# Patient Record
Sex: Male | Born: 1990 | Race: White | Hispanic: No | Marital: Single | Smoking: Current every day smoker
Health system: Southern US, Community
[De-identification: ages and names within clinical notes are randomized; demographics above are authoritative.]

## PROBLEM LIST (undated history)

## (undated) HISTORY — PX: NO PAST SURGERIES: SHX2092

---
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
## 2011-01-17 ENCOUNTER — Emergency Department (HOSPITAL_COMMUNITY): Payer: Self-pay

## 2011-01-17 ENCOUNTER — Encounter: Payer: Self-pay | Admitting: Emergency Medicine

## 2011-01-17 ENCOUNTER — Emergency Department (HOSPITAL_COMMUNITY)
Admission: EM | Admit: 2011-01-17 | Discharge: 2011-01-17 | Disposition: A | Discharge status: Home / Self Care | Payer: Self-pay | Attending: Emergency Medicine | Admitting: Emergency Medicine

## 2011-01-17 DIAGNOSIS — F17200 Nicotine dependence, unspecified, uncomplicated: Secondary | ICD-10-CM | POA: Insufficient documentation

## 2011-01-17 DIAGNOSIS — W230XXA Caught, crushed, jammed, or pinched between moving objects, initial encounter: Secondary | ICD-10-CM | POA: Insufficient documentation

## 2011-01-17 DIAGNOSIS — IMO0002 Reserved for concepts with insufficient information to code with codable children: Secondary | ICD-10-CM | POA: Insufficient documentation

## 2011-01-17 DIAGNOSIS — S68119A Complete traumatic metacarpophalangeal amputation of unspecified finger, initial encounter: Secondary | ICD-10-CM

## 2011-01-17 DIAGNOSIS — Z23 Encounter for immunization: Secondary | ICD-10-CM | POA: Insufficient documentation

## 2011-01-17 DIAGNOSIS — Y9269 Other specified industrial and construction area as the place of occurrence of the external cause: Secondary | ICD-10-CM | POA: Insufficient documentation

## 2011-01-17 MED ORDER — TETANUS-DIPHTH-ACELL PERTUSSIS 5-2.5-18.5 LF-MCG/0.5 IM SUSP
0.5000 mL | Freq: Once | INTRAMUSCULAR | Status: AC
  Administered 2011-01-17: 0.5 mL via INTRAMUSCULAR
  Filled 2011-01-17: qty 0.5

## 2011-01-17 MED ORDER — ONDANSETRON HCL 4 MG/2ML IJ SOLN
4.0000 mg | Freq: Once | INTRAMUSCULAR | Status: AC
  Administered 2011-01-17: 4 mg via INTRAVENOUS

## 2011-01-17 MED ORDER — MORPHINE SULFATE 4 MG/ML IJ SOLN
6.0000 mg | Freq: Once | INTRAMUSCULAR | Status: AC
  Administered 2011-01-17: 6 mg via INTRAVENOUS

## 2011-01-17 MED ORDER — CEPHALEXIN 500 MG PO CAPS: 500.0000 mg | ORAL_CAPSULE | Freq: Four times a day (QID) | ORAL | Status: DC

## 2011-01-17 MED ORDER — ONDANSETRON HCL 4 MG/2ML IJ SOLN
INTRAMUSCULAR | Status: AC
  Administered 2011-01-17: 4 mg via INTRAVENOUS
  Filled 2011-01-17: qty 2

## 2011-01-17 MED ORDER — CEFAZOLIN SODIUM 1-5 GM-% IV SOLN
1.0000 g | Freq: Once | INTRAVENOUS | Status: AC
  Administered 2011-01-17: 1 g via INTRAVENOUS
  Filled 2011-01-17: qty 50

## 2011-01-17 MED ORDER — HYDROMORPHONE HCL PF 1 MG/ML IJ SOLN
INTRAMUSCULAR | Status: AC
  Administered 2011-01-17: 1 mg via INTRAVENOUS
  Filled 2011-01-17: qty 1

## 2011-01-17 MED ORDER — OXYCODONE-ACETAMINOPHEN 5-325 MG PO TABS: 2.0000 | ORAL_TABLET | ORAL | Status: DC | PRN

## 2011-01-17 MED ORDER — HYDROMORPHONE HCL PF 1 MG/ML IJ SOLN
1.0000 mg | Freq: Once | INTRAMUSCULAR | Status: AC
  Administered 2011-01-17: 1 mg via INTRAVENOUS

## 2011-01-17 MED ORDER — HYDROMORPHONE HCL PF 1 MG/ML IJ SOLN
1.0000 mg | Freq: Once | INTRAMUSCULAR | Status: AC
  Administered 2011-01-17: 1 mg via INTRAVENOUS
  Filled 2011-01-17: qty 1

## 2011-01-17 MED ORDER — MORPHINE SULFATE 4 MG/ML IJ SOLN
INTRAMUSCULAR | Status: AC
  Administered 2011-01-17: 6 mg via INTRAVENOUS
  Filled 2011-01-17: qty 2

## 2011-01-17 NOTE — ED Provider Notes (Signed)
History  Scribed for Rolan Bucco, MD, the patient was seen in room APA04. This chart was scribed by Hillery Hunter. The patient's care started at 12:10   CSN: [id number] Arrival date & time: 01/17/2011 11:44 AM   First MD Initiated Contact with Patient 01/17/11 1159      Chief Complaint  Patient presents with  . Extremity Laceration    The history is provided by the patient.  Philip Espinoza is a 20 y.o. male who presents to the Emergency Department with crush injury to right third and fourth fingers. He reports that he was cutting wood with a mechanical wood splitter when he accidentally got his right third and fourth fingers caught in the machine at work at 11:00am today.      History reviewed. No pertinent past medical history.  History reviewed. No pertinent past surgical history.  No family history on file.  History  Substance Use Topics  . Smoking status: Current Everyday Smoker  . Smokeless tobacco: Not on file  . Alcohol Use: Yes     on occasion      Review of Systems  HENT: Negative for neck pain.   Respiratory: Negative for shortness of breath.   Cardiovascular: Negative for chest pain.  Gastrointestinal: Negative for abdominal pain.  Musculoskeletal: Negative for back pain.  Skin: Positive for wound (laceration to third and fourth finger of the right hand).  Neurological: Negative for dizziness, weakness and light-headedness.  Psychiatric/Behavioral: Negative for confusion.  All other systems reviewed and are negative.    Allergies  Review of patient's allergies indicates no known allergies.  Home Medications   Current Outpatient Rx  Name Route Sig Dispense Refill  . CEPHALEXIN 500 MG PO CAPS Oral Take 1 capsule (500 mg total) by mouth 4 (four) times daily. 20 capsule 0  . OXYCODONE-ACETAMINOPHEN 5-325 MG PO TABS Oral Take 2 tablets by mouth every 4 (four) hours as needed for pain. 30 tablet 0    BP 135/85  Pulse 72  Temp(Src)  97.6 F (36.4 C) (Oral)  Resp 20  SpO2 100%  Physical Exam  Nursing note and vitals reviewed. Constitutional: He is oriented to person, place, and time. He appears well-developed and well-nourished.  HENT:  Head: Normocephalic and atraumatic.  Neck: Normal range of motion. Neck supple.  Cardiovascular: Normal rate, regular rhythm and normal heart sounds.   Pulmonary/Chest: Effort normal and breath sounds normal.  Abdominal: Soft. Bowel sounds are normal. There is no tenderness. There is no rebound and no guarding.  Musculoskeletal: Normal range of motion. He exhibits no edema and no tenderness.       Crush injury and partial amputation of distal phalanx third and fourth fingers  Normal movement in fingers Capillary refill is less than 2 No injury to the rest of the hand  Neurological: He is alert and oriented to person, place, and time.  Skin: Skin is warm and dry. No rash noted.  Psychiatric: He has a normal mood and affect.    ED Course  Procedures   Labs Reviewed - No data to display Dg Hand Complete Right  01/17/2011  *RADIOLOGY REPORT*  Clinical Data: Laceration  RIGHT HAND - COMPLETE 3+ VIEW  Comparison: None.  Findings: There has been traumatic amputation across the tuft of the distal phalanges long and ring fingers.  Overlying soft tissue defects.  The fracture line in the distal phalanx of the long finger extends to the base involving the subchondral cortex without distraction or step-off  deformity.  There is a nondisplaced oblique fracture across the tuft of the distal phalanx of the index finger.  IMPRESSION:  1.  Traumatic amputation across the distal phalanges  long and ring fingers as above. 2.  Minimally displaced tuft fracture, distal phalanx index finger.  Original Report Authenticated By: Osa Craver, M.D.     OTHER DATA REVIEWED: Nursing notes, vital signs reviewed.   DIAGNOSTIC STUDIES: Oxygen Saturation is 100% on room air, normal by my  interpretation.     ED COURSE / COORDINATION OF CARE: 12:00. Ordered: XR right hand, complete. 12:10. Ordered ceFAZolin (ANCEF) IVPB 1 g/50 mL premix ; TDaP (BOOSTRIX) injection 0.5 mL, Morphine 6mg  IV, Zofran 4mg  IV.  12:30. I discussed the case with orthopedic specialist who recommended that patient be discharged home to follow-up with referral in his office.  12:46. Patient reports pain not improved with Morphine. Ordered Dilaudid 1mg  IV.    MDM  Pt with apparent crush injury/partial amputation to fingers.  Will check x-ray to r/o extent of injury  Clinical impression: partial finger amputations      I personally performed the services described in this documentation, which was scribed in my presence.  The recorded information has been reviewed and considered.    Rolan Bucco, MD 01/17/11 [phone]

## 2011-01-17 NOTE — ED Notes (Signed)
Pt states that at approx 11:00 am today, he was cutting wood and hit his right hand (index, middle, and ring fingers) with mechanical wood splitter.

## 2011-01-17 NOTE — ED Notes (Signed)
EDP at bedside  

## 2011-01-19 ENCOUNTER — Encounter: Payer: Self-pay | Admitting: Orthopedic Surgery

## 2011-01-19 ENCOUNTER — Ambulatory Visit (INDEPENDENT_AMBULATORY_CARE_PROVIDER_SITE_OTHER): Payer: Self-pay | Admitting: Orthopedic Surgery

## 2011-01-19 VITALS — BP 130/70 | Ht 72.0 in | Wt 162.8 lb

## 2011-01-19 DIAGNOSIS — IMO0002 Reserved for concepts with insufficient information to code with codable children: Secondary | ICD-10-CM

## 2011-01-19 DIAGNOSIS — S68119A Complete traumatic metacarpophalangeal amputation of unspecified finger, initial encounter: Secondary | ICD-10-CM

## 2011-01-19 MED ORDER — OXYCODONE-ACETAMINOPHEN 5-325 MG PO TABS: 1.0000 | ORAL_TABLET | ORAL | Status: DC | PRN

## 2011-01-19 NOTE — Progress Notes (Signed)
Chief complaint: Amputations RIGHT ring and long finger HPI:(3) 20 year old male, status post partial amputation to RIGHT ring and long finger from a wood splitter. Injury occurred on the of November. He was evaluated in the emergency room. The wounds were cleaned and dressed. He came into the office today on the 26th for evaluation. He will need debridement and exploration of the RIGHT ring and long finger to assess the proper treatment.  Complaints of pain.  ROS:(2) vomiting, Changes in the skin, itching, numbness, tingling, dizziness, nervousness, bruising  PFSH: (1) , chills and fatigueMedical history negative surgical history negative family history diabetes social history he is single and he works in [employer] he has a mild smoking history no alcohol use.  Physical Exam(12) GENERAL: normal development   CDV: pulses are normal   Skin: normal  Lymph: nodes were not palpable/normal  Psychiatric: awake, alert and oriented  Neuro: normal sensation  MSK Ambulatory normal. 1 RIGHT Long  finger has some exposed bone and there does not appear to be soft tissue that can cover the bone. The IP joint flexion is intact as is extension. Color is good. No necrotic tissue is noted. 2 RIGHT ring finger This amputation is more transverse. The IP joint flexion is normal. Color is good. No necrotic tissue. There does appear to be some exposed bone, but it is minimal and should respond well to debridement. 3 RIGHT and LEFT hand, otherwise, normal range of motion, stability, strength and alignment. 4 Lower extremities normal range of motion, strength, stability and alignment.   Imaging: X-rays show distal phalanx fractures, ring finger, long finger and index finger area  Assessment: Partial amputations, RIGHT long finger and ring finger    Plan: Debridement RIGHT ring finger and long finger

## 2011-01-19 NOTE — Patient Instructions (Signed)
You have been scheduled for surgery.  All surgeries carry some risk.  Remember you always have the option of continued nonsurgical treatment. However in this situation the risks vs. the benefits favor surgery as the best treatment option. The risks of the surgery includes the following but is not limited to bleeding, infection, pulmonary embolus, death from anesthesia, nerve injury vascular injury or need for further surgery, continued pain.  Specific to this procedure the following risks and complications are rare but possible You will have nail deformity and some sensory loss to the digits

## 2011-01-20 ENCOUNTER — Encounter (HOSPITAL_COMMUNITY): Payer: Self-pay

## 2011-01-20 ENCOUNTER — Encounter (HOSPITAL_COMMUNITY)
Admission: RE | Admit: 2011-01-20 | Discharge: 2011-01-20 | Disposition: A | Discharge status: Home / Self Care | Payer: Self-pay | Source: Ambulatory Visit | Attending: Orthopedic Surgery | Admitting: Orthopedic Surgery

## 2011-01-20 LAB — CBC
HCT: 47 % (ref 39.0–52.0)
MCH: 31.9 pg (ref 26.0–34.0)
MCHC: 33.8 g/dL (ref 30.0–36.0)
MCV: 94.2 fL (ref 78.0–100.0)
RDW: 14 % (ref 11.5–15.5)
WBC: 7.4 10*3/uL (ref 4.0–10.5)

## 2011-01-20 NOTE — Patient Instructions (Addendum)
20 Philip Espinoza  01/20/2011   Your procedure is scheduled on:  01/21/2011  Report to [facility] at 11:30 AM.  Call this number if you have problems the morning of surgery: [phone]   Remember:   Do not eat food:After Midnight.  May have clear liquids:until Midnight .  Clear liquids include soda, tea, black coffee, apple or grape juice, broth.  Take these medicines the morning of surgery with A SIP OF WATER: Percocet   Do not wear jewelry, make-up or nail polish.  Do not wear lotions, powders, or perfumes. You may wear deodorant.  Do not shave 48 hours prior to surgery.  Do not bring valuables to the hospital.  Contacts, dentures or bridgework may not be worn into surgery.  Leave suitcase in the car. After surgery it may be brought to your room.  For patients admitted to the hospital, checkout time is 11:00 AM the day of discharge.   Patients discharged the day of surgery will not be allowed to drive home.  Name and phone number of your driver:   Special Instructions: CHG Shower Use Special Wash: 1/2 bottle night before surgery and 1/2 bottle morning of surgery.   Please read over the following fact sheets that you were given: Pain Booklet, MRSA Information, Surgical Site Infection Prevention, Anesthesia Post-op Instructions and Care and Recovery After Surgery     Wound Debridement A wound must be clean to heal. It also must get a good supply of blood. Anything that is stopping this must be taken out of the wound. This could be dead tissue, scar tissue, or a collection of fluid (pus). Something from outside the body also might have gotten into the wound. These items are removed in a procedure called debridement. Wounds that are not cleaned by debridement heal slowly or not at all. They can become infected. The infection can also spread to nearby areas or to other parts of the body through the blood. Debridement is sometimes done to get a sample of tissue from the wound. The  tissue can be checked under a microscope or sent to a lab for testing. LET YOUR CAREGIVER KNOW ABOUT:   Allergies to food or medicine.   Medicines taken, including vitamins, herbs, eyedrops, over-the-counter medicines, and creams.   Use of steroids (by mouth or creams).   Previous problems with anesthetics or numbing medicines.   History of bleeding problems or blood clots.   Previous surgery.   Other health problems, including diabetes and kidney problems.   Possibility of pregnancy, if this applies.  RISKS AND COMPLICATIONS  Problems from debridement are rare. If they do occur, they are usually not serious. Risks could include:  Bleeding that does not stop.   Infection.   Damage to healthy tissue inside the wound. This could include blood vessels or nerves.   Pain.   Lack of healing. This could mean another debridement is needed.  BEFORE THE PROCEDURE   The wound will be checked for signs of healing or infection. This may include:   Blood tests to check for infection.   Measuring the wound. This includes how deep it is. A metal tool (probe) may be used.   You must give informed consent. This requires signing a legal paper that gives permission for the procedure.   You may need to speak with the person who would give you anesthesia during the procedure (anesthesiologist). Sometimes anesthesia is not needed. If anesthesia is used, the area around the wound may  be numbed (local anesthesia). If the wound is deep or wide, you may be put to sleep during the surgery (general anesthesia). Ask your caregiver what to expect.   If you are having general anesthesia, do not eat or drink anything for at least 6 hours before the procedure. Ask if it is okay to take needed medicine with a sip of water.   This might be an outpatient procedure, meaning you will go home the same day. Make arrangements ahead of time for someone to drive you home. Also, make sure someone can stay with you  for a few days.   On the day of the debridement, your caregiver will need to know the last time you had anything to eat or drink. This includes water, gum, and candy.  PROCEDURE   Small monitors may be placed on your body. They are used to check your heart, blood pressure, and oxygen level.   You may be given medicine through an intravenous (IV) access tube.   You might be given a medicine to help you relax (sedative).   You may be given general anesthesia to help you sleep or local anesthesia to numb the area around the wound.  Surgical debridement:  Once you are asleep or numb, the wound may be washed with a sterile saltwater solution.   Scissors, surgical knives (scalpels), and surgical tweezers (forceps) will be used to remove dead or dying tissue. Any other material that should not be in the wound will also be taken out.   After the wound is clean, it will be washed again.   A bandage (dressing) may be placed over the wound.  Sometimes, other methods are used. These methods might be done along with surgical debridement or instead of it. They include: Mechanical debridement:  A moist dressing is placed over the wound. It is left in place until it is dry. The dressing is lifted off. This lifts away any dead tissue.   Sometimes, whirlpool baths are used.   Sometimes, pulsed lavage is done. This involves flushing the wound with sterile solution under pressure.  Chemical debridement:  A chemical medicine is put on the wound. The aim is to dissolve dead or dying tissue. Ointments may also be used.  Autolytic debridement:  A special dressing is used to trap moisture inside the wound. The goal is for the wound to heal naturally under the dressing. Healing takes longer with this treatment.  AFTER THE PROCEDURE   You will stay in a recovery area until the anesthesia has worn off. Your blood pressure and pulse will be checked periodically. You may continue to get fluids through the IV  for awhile. Once you have recovered, you should be able to go home.   Some pain after debridement is normal. You will probably be given pain medicine.   Before you go home, make sure you know how to care for the wound. This includes knowing when the dressing should be changed and how to change it.   Set a follow-up appointment before leaving.  HOME CARE INSTRUCTIONS   Take all medicine that has been prescribed. Follow the directions carefully. Do not take over-the-counter painkillers unless your caregiver says it is okay. Some of them can cause bleeding after surgical procedures.   Do not drive if you are taking narcotic pain medicine.   Follow all instructions about caring for your wound.   How active you can be will depend on your wound. Ask your caregiver whether there is  anything you should or should not do while you heal.   Keep all follow-up appointments.  SEEK MEDICAL CARE IF:   You have any questions about medicines.   You have any questions about caring for your wound.   Pain continues, even after taking pain medicine.   You have an oral temperature above 102 F (38.9 C).  SEEK IMMEDIATE MEDICAL CARE IF:   Pain increases, even after taking pain medicine.   A bad smell comes from the wound.   The wound becomes red or swollen.   Blood or other fluid is leaking from the wound.   The skin around the wound becomes Sivan Quast, blue, or black.   You have an oral temperature above 102 F (38.9 C), not controlled by medicine.  MAKE SURE YOU:   Understand these instructions.   Will watch your condition.   Will get help right away if you are not doing well or get worse.  Document Released: 2020/07/1809 Document Revised: 10/22/2010 Document Reviewed: 2020/07/1809 [facility] Patient Information 2012 [place], [place].      PATIENT INSTRUCTIONS POST-ANESTHESIA  IMMEDIATELY FOLLOWING SURGERY:  Do not drive or operate machinery for the first twenty four hours after surgery.  Do  not make any important decisions for twenty four hours after surgery or while taking narcotic pain medications or sedatives.  If you develop intractable nausea and vomiting or a severe headache please notify your doctor immediately.  FOLLOW-UP:  Please make an appointment with your surgeon as instructed. You do not need to follow up with anesthesia unless specifically instructed to do so.  WOUND CARE INSTRUCTIONS (if applicable):  Keep a dry clean dressing on the anesthesia/puncture wound site if there is drainage.  Once the wound has quit draining you may leave it open to air.  Generally you should leave the bandage intact for twenty four hours unless there is drainage.  If the epidural site drains for more than 36-48 hours please call the anesthesia department.  QUESTIONS?:  Please feel free to call your physician or the hospital operator if you have any questions, and they will be happy to assist you.     [facility] Anesthesia Department [address] [place] [phone]

## 2011-01-21 ENCOUNTER — Ambulatory Visit (HOSPITAL_COMMUNITY): Payer: Self-pay | Admitting: Anesthesiology

## 2011-01-21 ENCOUNTER — Encounter (HOSPITAL_COMMUNITY)
Admission: RE | Disposition: A | Discharge status: Home / Self Care | Payer: Self-pay | Source: Ambulatory Visit | Attending: Orthopedic Surgery

## 2011-01-21 ENCOUNTER — Ambulatory Visit (HOSPITAL_COMMUNITY)
Admission: RE | Admit: 2011-01-21 | Discharge: 2011-01-21 | Disposition: A | Discharge status: Home / Self Care | Payer: Self-pay | Source: Ambulatory Visit | Attending: Orthopedic Surgery | Admitting: Orthopedic Surgery

## 2011-01-21 ENCOUNTER — Telehealth: Payer: Self-pay | Admitting: Orthopedic Surgery

## 2011-01-21 ENCOUNTER — Encounter (HOSPITAL_COMMUNITY): Payer: Self-pay | Admitting: *Deleted

## 2011-01-21 ENCOUNTER — Encounter (HOSPITAL_COMMUNITY): Payer: Self-pay | Admitting: Anesthesiology

## 2011-01-21 DIAGNOSIS — S68119A Complete traumatic metacarpophalangeal amputation of unspecified finger, initial encounter: Secondary | ICD-10-CM

## 2011-01-21 DIAGNOSIS — Z01812 Encounter for preprocedural laboratory examination: Secondary | ICD-10-CM | POA: Insufficient documentation

## 2011-01-21 DIAGNOSIS — IMO0002 Reserved for concepts with insufficient information to code with codable children: Secondary | ICD-10-CM | POA: Insufficient documentation

## 2011-01-21 DIAGNOSIS — W3189XA Contact with other specified machinery, initial encounter: Secondary | ICD-10-CM | POA: Insufficient documentation

## 2011-01-21 HISTORY — PX: I & D EXTREMITY: SHX5045

## 2011-01-21 HISTORY — PX: AMPUTATION: SHX166

## 2011-01-21 SURGERY — IRRIGATION AND DEBRIDEMENT EXTREMITY
Anesthesia: General | Laterality: Right | Wound class: Dirty or Infected

## 2011-01-21 MED ORDER — LACTATED RINGERS IV SOLN
INTRAVENOUS | Status: DC
  Administered 2011-01-21: 13:00:00 via INTRAVENOUS

## 2011-01-21 MED ORDER — FENTANYL CITRATE 0.05 MG/ML IJ SOLN
INTRAMUSCULAR | Status: AC
  Administered 2011-01-21: 50 ug via INTRAVENOUS
  Filled 2011-01-21: qty 2

## 2011-01-21 MED ORDER — MIDAZOLAM HCL 2 MG/2ML IJ SOLN
1.0000 mg | INTRAMUSCULAR | Status: DC | PRN
  Administered 2011-01-21 (×2): 2 mg via INTRAVENOUS

## 2011-01-21 MED ORDER — CEFAZOLIN SODIUM 1-5 GM-% IV SOLN
INTRAVENOUS | Status: AC
  Filled 2011-01-21: qty 50

## 2011-01-21 MED ORDER — MIDAZOLAM HCL 2 MG/2ML IJ SOLN
INTRAMUSCULAR | Status: AC
  Administered 2011-01-21: 2 mg via INTRAVENOUS
  Filled 2011-01-21: qty 2

## 2011-01-21 MED ORDER — FENTANYL CITRATE 0.05 MG/ML IJ SOLN
INTRAMUSCULAR | Status: AC
  Filled 2011-01-21: qty 5

## 2011-01-21 MED ORDER — BUPIVACAINE HCL (PF) 0.5 % IJ SOLN
INTRAMUSCULAR | Status: AC
  Filled 2011-01-21: qty 30

## 2011-01-21 MED ORDER — FENTANYL CITRATE 0.05 MG/ML IJ SOLN
INTRAMUSCULAR | Status: DC | PRN
  Administered 2011-01-21 (×5): 50 ug via INTRAVENOUS
  Administered 2011-01-21: 25 ug via INTRAVENOUS
  Administered 2011-01-21: 50 ug via INTRAVENOUS
  Administered 2011-01-21: 25 ug via INTRAVENOUS

## 2011-01-21 MED ORDER — SODIUM CHLORIDE 0.9 % IR SOLN
Status: DC | PRN
  Administered 2011-01-21: 1000 mL

## 2011-01-21 MED ORDER — ONDANSETRON HCL 4 MG/2ML IJ SOLN: 4.0000 mg | Freq: Once | INTRAMUSCULAR | Status: DC | PRN

## 2011-01-21 MED ORDER — CEFAZOLIN SODIUM 1-5 GM-% IV SOLN: 1.0000 g | INTRAVENOUS | Status: DC

## 2011-01-21 MED ORDER — CHLORHEXIDINE GLUCONATE 4 % EX LIQD: 60.0000 mL | Freq: Once | CUTANEOUS | Status: DC

## 2011-01-21 MED ORDER — PROPOFOL 10 MG/ML IV EMUL
INTRAVENOUS | Status: AC
  Filled 2011-01-21: qty 20

## 2011-01-21 MED ORDER — LIDOCAINE HCL (PF) 1 % IJ SOLN
INTRAMUSCULAR | Status: AC
  Filled 2011-01-21: qty 5

## 2011-01-21 MED ORDER — BUPIVACAINE HCL (PF) 0.5 % IJ SOLN
INTRAMUSCULAR | Status: DC | PRN
  Administered 2011-01-21: 10 mL

## 2011-01-21 MED ORDER — OXYCODONE-ACETAMINOPHEN 5-325 MG PO TABS: 1.0000 | ORAL_TABLET | ORAL | Status: DC | PRN

## 2011-01-21 MED ORDER — PROPOFOL 10 MG/ML IV EMUL
INTRAVENOUS | Status: DC | PRN
  Administered 2011-01-21: 20 mL via INTRAVENOUS

## 2011-01-21 MED ORDER — FENTANYL CITRATE 0.05 MG/ML IJ SOLN
25.0000 ug | INTRAMUSCULAR | Status: DC | PRN
  Administered 2011-01-21 (×2): 50 ug via INTRAVENOUS

## 2011-01-21 MED ORDER — CEFAZOLIN SODIUM 1-5 GM-% IV SOLN
INTRAVENOUS | Status: DC | PRN
  Administered 2011-01-21: 1 g via INTRAVENOUS

## 2011-01-21 SURGICAL SUPPLY — 52 items
BAG HAMPER (MISCELLANEOUS) ×2 IMPLANT
BANDAGE ELASTIC 4 VELCRO NS (GAUZE/BANDAGES/DRESSINGS) IMPLANT
BANDAGE ESMARK 4X12 BL STRL LF (DISPOSABLE) ×1 IMPLANT
BANDAGE GAUZE ELAST BULKY 4 IN (GAUZE/BANDAGES/DRESSINGS) IMPLANT
BLADE SURG 15 STRL LF DISP TIS (BLADE) IMPLANT
BLADE SURG 15 STRL SS (BLADE) ×2
BNDG CMPR 12X4 ELC STRL LF (DISPOSABLE) ×1
BNDG COHESIVE 4X5 TAN NS LF (GAUZE/BANDAGES/DRESSINGS) IMPLANT
BNDG ESMARK 4X12 BLUE STRL LF (DISPOSABLE) ×2
CLOTH BEACON ORANGE TIMEOUT ST (SAFETY) ×2 IMPLANT
COVER LIGHT HANDLE STERIS (MISCELLANEOUS) ×4 IMPLANT
CUFF TOURNIQUET SINGLE 18IN (TOURNIQUET CUFF) ×2 IMPLANT
DECANTER SPIKE VIAL GLASS SM (MISCELLANEOUS) IMPLANT
DRSG XEROFORM 1X8 (GAUZE/BANDAGES/DRESSINGS) ×2 IMPLANT
ELECT REM PT RETURN 9FT ADLT (ELECTROSURGICAL) ×2
ELECTRODE REM PT RTRN 9FT ADLT (ELECTROSURGICAL) ×1 IMPLANT
GAUZE XEROFORM 1X8 LF (GAUZE/BANDAGES/DRESSINGS) ×1 IMPLANT
GLOVE BIOGEL PI IND STRL 7.0 (GLOVE) IMPLANT
GLOVE BIOGEL PI IND STRL 7.5 (GLOVE) IMPLANT
GLOVE BIOGEL PI INDICATOR 7.0 (GLOVE) ×1
GLOVE BIOGEL PI INDICATOR 7.5 (GLOVE) ×1
GLOVE ECLIPSE 7.0 STRL STRAW (GLOVE) ×1 IMPLANT
GLOVE SKINSENSE NS SZ8.0 LF (GLOVE) ×1
GLOVE SKINSENSE STRL SZ8.0 LF (GLOVE) ×1 IMPLANT
GLOVE SS BIOGEL STRL SZ 6.5 (GLOVE) IMPLANT
GLOVE SS N UNI LF 8.5 STRL (GLOVE) ×2 IMPLANT
GLOVE SUPERSENSE BIOGEL SZ 6.5 (GLOVE) ×1
GOWN STRL REIN XL XLG (GOWN DISPOSABLE) ×6 IMPLANT
HANDPIECE INTERPULSE COAX TIP (DISPOSABLE)
HEMOSTAT SURGICEL 4X8 (HEMOSTASIS) ×1 IMPLANT
INST SET MINOR BONE (KITS) ×1 IMPLANT
IV NS IRRIG 3000ML ARTHROMATIC (IV SOLUTION) IMPLANT
KIT ROOM TURNOVER APOR (KITS) ×2 IMPLANT
MANIFOLD NEPTUNE II (INSTRUMENTS) ×2 IMPLANT
NDL HYPO 25X1 1.5 SAFETY (NEEDLE) IMPLANT
NEEDLE HYPO 25X1 1.5 SAFETY (NEEDLE) ×2 IMPLANT
NS IRRIG 1000ML POUR BTL (IV SOLUTION) ×2 IMPLANT
PACK BASIC LIMB (CUSTOM PROCEDURE TRAY) ×2 IMPLANT
PAD ARMBOARD 7.5X6 YLW CONV (MISCELLANEOUS) ×2 IMPLANT
PAD CAST 4YDX4 CTTN HI CHSV (CAST SUPPLIES) ×1 IMPLANT
PADDING CAST COTTON 4X4 STRL (CAST SUPPLIES)
SET BASIN LINEN APH (SET/KITS/TRAYS/PACK) ×2 IMPLANT
SET HNDPC FAN SPRY TIP SCT (DISPOSABLE) ×1 IMPLANT
SOL PREP PROV IODINE SCRUB 4OZ (MISCELLANEOUS) ×2 IMPLANT
SPONGE GAUZE 4X4 12PLY (GAUZE/BANDAGES/DRESSINGS) ×2 IMPLANT
SUT CHROMIC 5 0 P 3 (SUTURE) ×1 IMPLANT
SUT ETHILON 3 0 FSL (SUTURE) ×1 IMPLANT
SWAB CULTURE LIQ STUART DBL (MISCELLANEOUS) IMPLANT
SYR BULB IRRIGATION 50ML (SYRINGE) ×2 IMPLANT
SYR CONTROL 10ML LL (SYRINGE) ×1 IMPLANT
TUBE ANAEROBIC PORT A CUL  W/M (MISCELLANEOUS) ×1 IMPLANT
VESSEL CANN W0 1 W VA 30003 (MISCELLANEOUS) IMPLANT

## 2011-01-21 NOTE — Transfer of Care (Signed)
Immediate Anesthesia Transfer of Care Note  Patient: Philip Espinoza  Procedure(s) Performed:  IRRIGATION AND DEBRIDEMENT EXTREMITY -  Right Ring and Right Long Finger; AMPUTATION DIGIT - Partial amputation of Right Long Finger  Patient Location: PACU  Anesthesia Type: General  Level of Consciousness: awake, alert  and oriented  Airway & Oxygen Therapy: Patient Spontanous Breathing and Patient connected to face mask oxygen  Post-op Assessment: Report given to PACU RN  Post vital signs: Reviewed and stable  Complications: No apparent anesthesia complications

## 2011-01-21 NOTE — Anesthesia Procedure Notes (Addendum)
Performed by: Glynn Octave   Procedure Name: LMA Insertion Date/Time: 01/21/2011 1:51 PM Performed by: Glynn Octave Pre-anesthesia Checklist: Patient identified, Patient being monitored, Emergency Drugs available, Timeout performed and Suction available Patient Re-evaluated:Patient Re-evaluated prior to inductionOxygen Delivery Method: Circle System Utilized Preoxygenation: Pre-oxygenation with 100% oxygen Intubation Type: IV induction Ventilation: Mask ventilation without difficulty LMA: LMA inserted LMA Size: 4.0 Number of attempts: 1 Placement Confirmation: positive ETCO2 and breath sounds checked- equal and bilateral

## 2011-01-21 NOTE — Interval H&P Note (Signed)
History and Physical Interval Note:  01/21/2011 1:35 PM  Philip Espinoza  has presented today for surgery, with the diagnosis of amputation right ring and long finger  The various methods of treatment have been discussed with the patient and family. After consideration of risks, benefits and other options for treatment, the patient has consented to  Procedure(s): IRRIGATION AND DEBRIDEMENT RIGHT LONG AND RING FINGER  as a surgical intervention .  The patients' history has been reviewed, patient re examined, no change in status, stable for surgery.  I have reviewed the patients' chart and labs.  Questions were answered to the patient's satisfaction.     Fuller Canada

## 2011-01-21 NOTE — Telephone Encounter (Signed)
Archie Patten, nurse at [facility] out-patient surgery, called to ask about discharge orders.  States Dr. Mort Sawyers orders re: wound care are "to keep clean and dry."  She is asking if patient is to re-dress?  Patient's post op#1 appointment is Monday 01/26/11/  Please advise patient - Ph #s are: [phone] [facility])   [phone] [facility]

## 2011-01-21 NOTE — Addendum Note (Signed)
Addendum  created 01/21/11 1524 by Glynn Octave   Modules edited:Notes Section

## 2011-01-21 NOTE — H&P (Signed)
  Fuller Canada, MD 01/20/2011 8:11 AM Signed  Chief complaint: Amputations RIGHT ring and long finger  HPI:(3) 20 year old male, status post partial amputation to RIGHT ring and long finger from a wood splitter. Injury occurred on the of November. He was evaluated in the emergency room. The wounds were cleaned and dressed. He came into the office today on the 26th for evaluation. He will need debridement and exploration of the RIGHT ring and long finger to assess the proper treatment.  Complaints of pain.  ROS:(2) vomiting, Changes in the skin, itching, numbness, tingling, dizziness, nervousness, bruising  PFSH: (1) , chills and fatigueMedical history negative surgical history negative family history diabetes social history he is single and he works in [employer] he has a mild smoking history no alcohol use.  Physical Exam(12)  GENERAL: normal development  CDV: pulses are normal  Skin: normal  Lymph: nodes were not palpable/normal  Psychiatric: awake, alert and oriented  Neuro: normal sensation  MSK Ambulatory normal.  1 RIGHT Long finger has some exposed bone and there does not appear to be soft tissue that can cover the bone. The IP joint flexion is intact as is extension. Color is good. No necrotic tissue is noted.  2 RIGHT ring finger This amputation is more transverse. The IP joint flexion is normal. Color is good. No necrotic tissue. There does appear to be some exposed bone, but it is minimal and should respond well to debridement.  3 RIGHT and LEFT hand, otherwise, normal range of motion, stability, strength and alignment.  4 Lower extremities normal range of motion, strength, stability and alignment.  Imaging: X-rays show distal phalanx fractures, ring finger, long finger and index finger area  Assessment: Partial amputations, RIGHT long finger and ring finger  Plan: Debridement RIGHT ring finger and long finger

## 2011-01-21 NOTE — Anesthesia Preprocedure Evaluation (Signed)
Anesthesia Evaluation  Patient identified by MRN, date of birth, ID band Patient awake    Reviewed: Allergy & Precautions, H&P , NPO status , Patient's Chart, lab work & pertinent test results  History of Anesthesia Complications Negative for: history of anesthetic complications  Airway Mallampati: I      Dental  (+) Teeth Intact   Pulmonary neg pulmonary ROS,  clear to auscultation        Cardiovascular neg cardio ROS Regular     Neuro/Psych    GI/Hepatic   Endo/Other    Renal/GU      Musculoskeletal   Abdominal   Peds  Hematology   Anesthesia Other Findings   Reproductive/Obstetrics                           Anesthesia Physical Anesthesia Plan  ASA: I  Anesthesia Plan: General   Post-op Pain Management:    Induction: Intravenous  Airway Management Planned: LMA  Additional Equipment:   Intra-op Plan:   Post-operative Plan: Extubation in OR  Informed Consent: I have reviewed the patients History and Physical, chart, labs and discussed the procedure including the risks, benefits and alternatives for the proposed anesthesia with the patient or authorized representative who has indicated his/her understanding and acceptance.     Plan Discussed with:   Anesthesia Plan Comments:         Anesthesia Quick Evaluation

## 2011-01-21 NOTE — Anesthesia Postprocedure Evaluation (Addendum)
  Anesthesia Post-op Note  Patient: Philip Espinoza  Procedure(s) Performed:  IRRIGATION AND DEBRIDEMENT EXTREMITY -  Right Ring and Right Long Finger; AMPUTATION DIGIT - Partial amputation of Right Long Finger  Patient Location: PACU  Anesthesia Type: General  Level of Consciousness: awake, alert  and oriented  Airway and Oxygen Therapy: Patient Spontanous Breathing  Post-op Pain: mild  Post-op Assessment: Post-op Vital signs reviewed, Patient's Cardiovascular Status Stable, Respiratory Function Stable and No signs of Nausea or vomiting  Post-op Vital Signs: Reviewed and stable  Complications: No apparent anesthesia complications  01/21/11  VSS, patient doing well.  Denies sore throat, recall or PONV.  No apparent anesthesia complications.

## 2011-01-21 NOTE — Brief Op Note (Signed)
01/21/2011  2:53 PM  PATIENT:  Philip Espinoza  20 y.o. male  PRE-OPERATIVE DIAGNOSIS:  Status Post Amputation Right Ring and Right Long Finger  POST-OPERATIVE DIAGNOSIS:  * same  *  PROCEDURE:  Procedure(s): IRRIGATION AND DEBRIDEMENT LONG AND RING FINGER WITH PARTIAL AMPUTATION LONG FINGER   SURGEON:  Surgeon(s): Fuller Canada, MD  PHYSICIAN ASSISTANT:   ASSISTANTS: none   ANESTHESIA:   general  EBL: 0Total I/O In: 300 [I.V.:300] Out: -   BLOOD ADMINISTERED:none  DRAINS: none   LOCAL MEDICATIONS USED:  MARCAINE 10 C C  SPECIMEN:  No Specimen  DISPOSITION OF SPECIMEN:  N/A  COUNTS:  YES  TOURNIQUET: NONE  DICTATION: .Dragon Dictation  PLAN OF CARE: TO PACU THEN HOME   PATIENT DISPOSITION:  PACU - hemodynamically stable.   Delay start of Pharmacological VTE agent (>24hrs) due to surgical blood loss or risk of bleeding:  NA

## 2011-01-21 NOTE — Op Note (Signed)
01/21/2011  2:53 PM  PATIENT:  Philip Espinoza  20 y.o. 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 There was exposed bone and this was debrided back. I then attempted to close the wound but would not close. I then had to disarticulate the distal phalanx from the middle phalanx. The neurovascular structures were placed in traction and resected and allowed to fall back into the wound. The flexor tendon was also resected back to an appropriate position.  We then closed the wound with 2-0 nylon suture  The ring finger was then dressed. There was a small piece of bone protruding from the amputation site. This was debrided. To do this we opened the nail bed. Quantity of bone debrided we close the nailbed with 5-0 chromic suture. We placed 2 other chromic sutures and 1 nylon suture to close the wound. This gave a good closure.  The wound was then dressed with Surgicel and wet dressing followed by Protonix.  SURGEON:  Surgeon(s): Fuller Canada, MD  PHYSICIAN ASSISTANT:   ASSISTANTS: none   ANESTHESIA:   general  EBL: 0Total I/O In: 300 [I.V.:300] Out: -   BLOOD ADMINISTERED:none  DRAINS: none   LOCAL MEDICATIONS USED:  MARCAINE 10 C C  SPECIMEN:  No Specimen  DISPOSITION OF SPECIMEN:  N/A  COUNTS:  YES  TOURNIQUET: NONE  DICTATION: .Dragon Dictation  PLAN OF CARE: TO PACU THEN HOME   PATIENT DISPOSITION:  PACU - hemodynamically stable.   Delay  start of Pharmacological VTE agent (>24hrs) due to surgical blood loss or risk of bleeding:  NA   At followup visit no dressing change needs to be done.

## 2011-01-22 LAB — POCT I-STAT 4, (NA,K, GLUC, HGB,HCT)
Glucose, Bld: 92 mg/dL (ref 70–99)
Hemoglobin: 17 g/dL (ref 13.0–17.0)
Potassium: 3.9 mEq/L (ref 3.5–5.1)
Sodium: 140 mEq/L (ref 135–145)

## 2011-01-22 MED ORDER — HEMOSTATIC AGENTS (NO CHARGE) OPTIME
TOPICAL | Status: AC | PRN
  Administered 2011-01-21: 1 via TOPICAL

## 2011-01-26 ENCOUNTER — Ambulatory Visit (INDEPENDENT_AMBULATORY_CARE_PROVIDER_SITE_OTHER): Payer: Self-pay | Admitting: Orthopedic Surgery

## 2011-01-26 ENCOUNTER — Encounter: Payer: Self-pay | Admitting: Orthopedic Surgery

## 2011-01-26 VITALS — BP 128/60 | Ht 72.0 in | Wt 162.0 lb

## 2011-01-26 DIAGNOSIS — IMO0002 Reserved for concepts with insufficient information to code with codable children: Secondary | ICD-10-CM

## 2011-01-26 DIAGNOSIS — S68119A Complete traumatic metacarpophalangeal amputation of unspecified finger, initial encounter: Secondary | ICD-10-CM | POA: Insufficient documentation

## 2011-01-26 MED ORDER — OXYCODONE-ACETAMINOPHEN 5-325 MG PO TABS: 1.0000 | ORAL_TABLET | ORAL | Status: AC | PRN

## 2011-01-26 NOTE — Progress Notes (Signed)
POST OP VISIT # 1   01/21/2011  2:53 PM  PATIENT: Philip Espinoza 20 y.o. 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

## 2011-01-26 NOTE — Telephone Encounter (Signed)
This has been addressed and answered by Dr Romeo Apple at patient's post op appointment today.

## 2011-01-26 NOTE — Patient Instructions (Signed)
KEEP CLEAN AND DRY.

## 2011-01-27 ENCOUNTER — Encounter (HOSPITAL_COMMUNITY): Payer: Self-pay | Admitting: Orthopedic Surgery

## 2011-02-04 ENCOUNTER — Ambulatory Visit (INDEPENDENT_AMBULATORY_CARE_PROVIDER_SITE_OTHER): Payer: Self-pay | Admitting: Orthopedic Surgery

## 2011-02-04 ENCOUNTER — Encounter: Payer: Self-pay | Admitting: Orthopedic Surgery

## 2011-02-04 DIAGNOSIS — S68119A Complete traumatic metacarpophalangeal amputation of unspecified finger, initial encounter: Secondary | ICD-10-CM

## 2011-02-04 DIAGNOSIS — IMO0002 Reserved for concepts with insufficient information to code with codable children: Secondary | ICD-10-CM

## 2011-02-04 NOTE — Patient Instructions (Signed)
Keep clean and dry

## 2011-02-04 NOTE — Progress Notes (Signed)
   Followup ring and long finger partial amputations  Dressing change  Stitch removal  Digital block was used to numb the long finger and then the stitches were removed debridement was done. Dressings were placed  Him back Monday or Tuesday for dressing change. At this point we should be able to use a smaller dressing.

## 2011-02-10 ENCOUNTER — Ambulatory Visit (INDEPENDENT_AMBULATORY_CARE_PROVIDER_SITE_OTHER): Payer: Self-pay | Admitting: Orthopedic Surgery

## 2011-02-10 VITALS — BP 120/60 | Ht 72.0 in | Wt 162.0 lb

## 2011-02-10 DIAGNOSIS — IMO0002 Reserved for concepts with insufficient information to code with codable children: Secondary | ICD-10-CM

## 2011-02-10 DIAGNOSIS — S68119A Complete traumatic metacarpophalangeal amputation of unspecified finger, initial encounter: Secondary | ICD-10-CM

## 2011-02-10 NOTE — Patient Instructions (Addendum)
Go to hospital for therapy   Then start soaking hand in warm water for 20 min 3 x a day

## 2011-02-11 ENCOUNTER — Encounter: Payer: Self-pay | Admitting: Orthopedic Surgery

## 2011-02-11 NOTE — Progress Notes (Signed)
Patient ID: Philip Espinoza, male   DOB: [birth year], 20 y.o.   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  Return for wound check in 4-6 weeks

## 2011-03-12 ENCOUNTER — Ambulatory Visit: Payer: Self-pay | Admitting: Orthopedic Surgery

## 2011-03-12 ENCOUNTER — Encounter: Payer: Self-pay | Admitting: Orthopedic Surgery

## 2012-07-14 ENCOUNTER — Emergency Department (HOSPITAL_COMMUNITY)
Admission: EM | Admit: 2012-07-14 | Discharge: 2012-07-14 | Disposition: A | Discharge status: Home / Self Care | Payer: Self-pay | Attending: Emergency Medicine | Admitting: Emergency Medicine

## 2012-07-14 ENCOUNTER — Encounter (HOSPITAL_COMMUNITY): Payer: Self-pay | Admitting: Emergency Medicine

## 2012-07-14 DIAGNOSIS — L0231 Cutaneous abscess of buttock: Secondary | ICD-10-CM | POA: Insufficient documentation

## 2012-07-14 MED ORDER — SULFAMETHOXAZOLE-TMP DS 800-160 MG PO TABS: 1.0000 | ORAL_TABLET | Freq: Two times a day (BID) | ORAL | Status: DC

## 2012-07-14 MED ORDER — HYDROCODONE-ACETAMINOPHEN 5-325 MG PO TABS: 1.0000 | ORAL_TABLET | ORAL | Status: DC | PRN

## 2012-07-14 MED ORDER — LIDOCAINE HCL (PF) 2 % IJ SOLN
INTRAMUSCULAR | Status: AC
  Administered 2012-07-14: 10 mL
  Filled 2012-07-14: qty 10

## 2012-07-14 MED ORDER — LIDOCAINE HCL (PF) 1 % IJ SOLN
INTRAMUSCULAR | Status: AC
  Filled 2012-07-14: qty 5

## 2012-07-14 NOTE — ED Notes (Signed)
States that he has had a painful, swollen area on his right buttock for about 1 week.  States he has been squeezing the area and getting some return, however, now the drainage has stopped.

## 2012-07-14 NOTE — ED Provider Notes (Signed)
History     CSN: [id number]  Arrival date & time 07/14/12  1711   First MD Initiated Contact with Patient 07/14/12 1819      Chief Complaint  Patient presents with  . Abscess    (Consider location/radiation/quality/duration/timing/severity/associated sxs/prior treatment) HPI Comments: Philip Espinoza is a 22 y.o. Male who complains of a sore on his right back, worsening for several days. He attempted to squeeze pus out of it several times. He feels like it is somewhat smaller today than yesterday. He denies systemic symptoms including generalized pain, weakness, dizziness, nausea, vomiting, fever, or chills. He has not had this previously. There are no known modifying factors.  Patient is a 22 y.o. male presenting with abscess. The history is provided by the patient.  Abscess   No past medical history on file.  Past Surgical History  Procedure Laterality Date  . No past surgeries    . I&d extremity  01/21/2011    Procedure: IRRIGATION AND DEBRIDEMENT EXTREMITY;  Surgeon: Fuller Canada, MD;  Location: [facility];  Service: Orthopedics;  Laterality: Right;   Right Ring and Right Long Finger  . Amputation  01/21/2011    Procedure: AMPUTATION DIGIT;  Surgeon: Fuller Canada, MD;  Location: [facility];  Service: Orthopedics;  Laterality: Right;  Partial amputation of Right Long Finger    Family History  Problem Relation Age of Onset  . Diabetes    . Anesthesia problems Neg Hx   . Hypotension Neg Hx   . Malignant hyperthermia Neg Hx   . Pseudochol deficiency Neg Hx     History  Substance Use Topics  . Smoking status: Never Smoker   . Smokeless tobacco: Not on file  . Alcohol Use: Yes     Comment: beer on occasion      Review of Systems  Allergies  Review of patient's allergies indicates no known allergies.  Home Medications   Current Outpatient Rx  Name  Route  Sig  Dispense  Refill  . HYDROcodone-acetaminophen (NORCO) 5-325 MG per tablet   Oral   Take 1  tablet by mouth every 4 (four) hours as needed for pain.   10 tablet   0   . sulfamethoxazole-trimethoprim (BACTRIM DS) 800-160 MG per tablet   Oral   Take 1 tablet by mouth 2 (two) times daily.   14 tablet   0     BP 126/99  Pulse 89  Temp(Src) 97.9 F (36.6 C) (Oral)  Resp 18  Ht 6\' 1"  (1.854 m)  Wt 175 lb (79.379 kg)  BMI 23.09 kg/m2  SpO2 96%  Physical Exam  Nursing note and vitals reviewed. Constitutional: He is oriented to person, place, and time. He appears well-developed and well-nourished.  HENT:  Head: Normocephalic and atraumatic.  Right Ear: External ear normal.  Left Ear: External ear normal.  Eyes: Conjunctivae and EOM are normal. Pupils are equal, round, and reactive to light.  Neck: Normal range of motion and phonation normal. Neck supple.  Cardiovascular: Normal rate.   Pulmonary/Chest: Effort normal. He exhibits no bony tenderness.  Abdominal: Soft. Normal appearance.  Musculoskeletal: Normal range of motion.  Neurological: He is alert and oriented to person, place, and time. He has normal strength. No cranial nerve deficit or sensory deficit. He exhibits normal muscle tone. Coordination normal.  Skin: Skin is warm, dry and intact.  Cutaneous abscess, right lateral buttock approximately 4 cm in diameter with mild surrounding cellulitis. There are areas of pustules in the center of  the lesion. There is only mild pain to palpation of the area. There is no clinical evidence for deep soft tissue infection.  Psychiatric: He has a normal mood and affect. His behavior is normal. Judgment and thought content normal.    ED Course  Procedures (including critical care time)   INCISION AND DRAINAGE Performed by: Flint Melter Consent: Verbal consent obtained. Risks and benefits: risks, benefits and alternatives were discussed Type: abscess  Body area: right buttock  Anesthesia: local infiltration  Incision was made with a scalpel.  Local anesthetic:  lidocaine 1% without epinephrine  Anesthetic total: 4 ml  Complexity: complex Blunt dissection to break up loculations  Drainage: purulent  Drainage amount: moderate  Not packed d/t < 5cm  Patient tolerance: Patient tolerated the procedure well with no immediate complications.     1. Abscess of buttock       MDM  Superficial cutaneous abscess successfully treated with I&D in the emergency department. Very minimal surrounding cellulitis will require a short course of antibiotic.  Nursing Notes Reviewed/ Care Coordinated Applicable Imaging Reviewed Interpretation of Laboratory Data incorporated into ED treatment  Plan: Home Medications- Septra, Norco; Home Treatments- soak and soap; Recommended follow up- Return prn       Flint Melter, MD 07/14/12 1842

## 2012-08-10 ENCOUNTER — Encounter (HOSPITAL_COMMUNITY): Payer: Self-pay | Admitting: *Deleted

## 2012-08-10 ENCOUNTER — Emergency Department (HOSPITAL_COMMUNITY): Payer: Self-pay

## 2012-08-10 ENCOUNTER — Emergency Department (HOSPITAL_COMMUNITY)
Admission: EM | Admit: 2012-08-10 | Discharge: 2012-08-10 | Disposition: A | Discharge status: Home / Self Care | Payer: Self-pay | Attending: Emergency Medicine | Admitting: Emergency Medicine

## 2012-08-10 DIAGNOSIS — F17200 Nicotine dependence, unspecified, uncomplicated: Secondary | ICD-10-CM | POA: Insufficient documentation

## 2012-08-10 DIAGNOSIS — S52509A Unspecified fracture of the lower end of unspecified radius, initial encounter for closed fracture: Secondary | ICD-10-CM | POA: Insufficient documentation

## 2012-08-10 DIAGNOSIS — Y9389 Activity, other specified: Secondary | ICD-10-CM | POA: Insufficient documentation

## 2012-08-10 DIAGNOSIS — S52609A Unspecified fracture of lower end of unspecified ulna, initial encounter for closed fracture: Secondary | ICD-10-CM | POA: Insufficient documentation

## 2012-08-10 DIAGNOSIS — S52201A Unspecified fracture of shaft of right ulna, initial encounter for closed fracture: Secondary | ICD-10-CM

## 2012-08-10 DIAGNOSIS — Y92410 Unspecified street and highway as the place of occurrence of the external cause: Secondary | ICD-10-CM | POA: Insufficient documentation

## 2012-08-10 MED ORDER — HYDROCODONE-ACETAMINOPHEN 5-325 MG PO TABS: 2.0000 | ORAL_TABLET | ORAL | Status: DC | PRN

## 2012-08-10 MED ORDER — HYDROCODONE-ACETAMINOPHEN 5-325 MG PO TABS
2.0000 | ORAL_TABLET | Freq: Once | ORAL | Status: AC
  Administered 2012-08-10: 2 via ORAL
  Filled 2012-08-10: qty 2

## 2012-08-10 NOTE — ED Provider Notes (Signed)
Medical screening examination/treatment/procedure(s) were performed by non-physician practitioner and as supervising physician I was immediately available for consultation/collaboration.   Sofiah Lyne, MD 08/10/12 2338 
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
## 2012-08-10 NOTE — ED Notes (Signed)
Patient with no complaints at this time. Respirations even and unlabored. Skin warm/dry. Discharge instructions reviewed with patient at this time. Patient given opportunity to voice concerns/ask questions. Patient discharged at this time and left Emergency Department with steady gait.   
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
## 2012-08-10 NOTE — ED Notes (Signed)
Ice pack to (R) wrist 

## 2012-08-18 ENCOUNTER — Emergency Department (HOSPITAL_COMMUNITY)
Admission: EM | Admit: 2012-08-18 | Discharge: 2012-08-18 | Disposition: A | Discharge status: Home / Self Care | Payer: Self-pay | Attending: Orthopaedic Surgery | Admitting: Orthopaedic Surgery

## 2012-08-18 ENCOUNTER — Encounter (HOSPITAL_COMMUNITY): Payer: Self-pay | Admitting: *Deleted

## 2012-08-18 DIAGNOSIS — M25539 Pain in unspecified wrist: Secondary | ICD-10-CM | POA: Insufficient documentation

## 2012-08-18 DIAGNOSIS — G8911 Acute pain due to trauma: Secondary | ICD-10-CM | POA: Insufficient documentation

## 2012-08-18 MED ORDER — TRAMADOL HCL 50 MG PO TABS: 50.0000 mg | ORAL_TABLET | Freq: Four times a day (QID) | ORAL | Status: AC | PRN

## 2012-08-18 NOTE — ED Notes (Signed)
Pt seen and d/c'd by Dr.Keeling. Prescription given by Dr.Keeling.
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
## 2012-08-18 NOTE — ED Notes (Signed)
To be seen here by Dr. Hilda Lias for f/u.

## 2012-08-18 NOTE — Discharge Summary (Signed)
Physician Discharge Summary  Patient ID: Philip Espinoza MRN: [id number] DOB/AGE: [birth year] 21 y.o.  Admit date: 08/18/2012 Discharge date: 08/18/2012  Admission Diagnoses: Fracture distal radius and ulna on the right  Discharge Diagnoses: same Active Problems:   * No active hospital problems. *   Discharged Condition: good  Hospital Course: He was seen in the ER today. He has a sugar tong splint on the right in good condition.  NV is intact.  He is to be seen in the office Monday or Tuesday of next week.  Consults: None  Significant Diagnostic Studies: radiology: X-Ray: fracture of distal radius and ulnar on the right nondisplaced.  Treatments: sugar tong splint previously applied.  Discharge Exam: Blood pressure 132/69, pulse 78, temperature 97.7 F (36.5 C), temperature source Oral, resp. rate 16, SpO2 99.00%. General appearance: alert, cooperative and appears stated age .  NV is intact.  Disposition: 01-Home or Self Care     Medication List    STOP taking these medications       HYDROcodone-acetaminophen 5-325 MG per tablet  Commonly known as:  NORCO/VICODIN      TAKE these medications       traMADol 50 MG tablet  Commonly known as:  ULTRAM  Take 1 tablet (50 mg total) by mouth every 6 (six) hours as needed for pain.           Follow-up Information   Follow up with Darreld Mclean, MD In 4 days.   Contact information:   [address] [place] [address] [phone]       Signed: Darreld Mclean 08/18/2012, 9:40 AM

## 2012-08-23 ENCOUNTER — Other Ambulatory Visit (HOSPITAL_COMMUNITY): Payer: Self-pay | Admitting: Orthopaedic Surgery

## 2012-08-23 ENCOUNTER — Ambulatory Visit (HOSPITAL_COMMUNITY)
Admission: RE | Admit: 2012-08-23 | Discharge: 2012-08-23 | Disposition: A | Discharge status: Home / Self Care | Payer: Self-pay | Source: Ambulatory Visit | Attending: Orthopaedic Surgery | Admitting: Orthopaedic Surgery

## 2012-08-23 DIAGNOSIS — S52201D Unspecified fracture of shaft of right ulna, subsequent encounter for closed fracture with routine healing: Secondary | ICD-10-CM

## 2012-08-23 DIAGNOSIS — S5291XD Unspecified fracture of right forearm, subsequent encounter for closed fracture with routine healing: Secondary | ICD-10-CM

## 2012-08-23 DIAGNOSIS — Z4789 Encounter for other orthopedic aftercare: Secondary | ICD-10-CM | POA: Insufficient documentation

## 2012-09-15 ENCOUNTER — Other Ambulatory Visit (HOSPITAL_COMMUNITY): Payer: Self-pay | Admitting: Orthopaedic Surgery

## 2012-09-15 ENCOUNTER — Ambulatory Visit (HOSPITAL_COMMUNITY)
Admission: RE | Admit: 2012-09-15 | Discharge: 2012-09-15 | Disposition: A | Discharge status: Home / Self Care | Payer: Self-pay | Source: Ambulatory Visit | Attending: Orthopaedic Surgery | Admitting: Orthopaedic Surgery

## 2012-09-15 DIAGNOSIS — Z4789 Encounter for other orthopedic aftercare: Secondary | ICD-10-CM | POA: Insufficient documentation

## 2012-09-15 DIAGNOSIS — T07XXXA Unspecified multiple injuries, initial encounter: Secondary | ICD-10-CM

## 2012-10-03 ENCOUNTER — Ambulatory Visit (HOSPITAL_COMMUNITY)
Admission: RE | Admit: 2012-10-03 | Discharge: 2012-10-03 | Disposition: A | Discharge status: Home / Self Care | Payer: Self-pay | Source: Ambulatory Visit | Attending: Orthopaedic Surgery | Admitting: Orthopaedic Surgery

## 2012-10-03 ENCOUNTER — Other Ambulatory Visit (HOSPITAL_COMMUNITY): Payer: Self-pay | Admitting: Orthopaedic Surgery

## 2012-10-03 DIAGNOSIS — S52531A Colles' fracture of right radius, initial encounter for closed fracture: Secondary | ICD-10-CM

## 2012-10-03 DIAGNOSIS — Z4789 Encounter for other orthopedic aftercare: Secondary | ICD-10-CM | POA: Insufficient documentation

## 2014-12-29 ENCOUNTER — Inpatient Hospital Stay (HOSPITAL_COMMUNITY): Payer: Self-pay

## 2014-12-29 ENCOUNTER — Inpatient Hospital Stay (HOSPITAL_COMMUNITY): Payer: Self-pay | Admitting: Certified Registered"

## 2014-12-29 ENCOUNTER — Encounter (HOSPITAL_COMMUNITY): Payer: Self-pay

## 2014-12-29 ENCOUNTER — Emergency Department (HOSPITAL_COMMUNITY): Payer: Self-pay

## 2014-12-29 ENCOUNTER — Encounter (HOSPITAL_COMMUNITY)
Admission: EM | Disposition: A | Discharge status: Home / Self Care | Payer: Self-pay | Source: Home / Self Care | Attending: Orthopaedic Surgery

## 2014-12-29 ENCOUNTER — Inpatient Hospital Stay (HOSPITAL_COMMUNITY)
Admission: EM | Admit: 2014-12-29 | Discharge: 2014-12-31 | DRG: 464 | Disposition: A | Discharge status: Home / Self Care | Payer: Self-pay | Attending: Orthopaedic Surgery | Admitting: Orthopaedic Surgery

## 2014-12-29 DIAGNOSIS — F17200 Nicotine dependence, unspecified, uncomplicated: Secondary | ICD-10-CM | POA: Diagnosis present

## 2014-12-29 DIAGNOSIS — Z8781 Personal history of (healed) traumatic fracture: Secondary | ICD-10-CM

## 2014-12-29 DIAGNOSIS — S72402C Unspecified fracture of lower end of left femur, initial encounter for open fracture type IIIA, IIIB, or IIIC: Secondary | ICD-10-CM

## 2014-12-29 DIAGNOSIS — S72352C Displaced comminuted fracture of shaft of left femur, initial encounter for open fracture type IIIA, IIIB, or IIIC: Principal | ICD-10-CM | POA: Diagnosis present

## 2014-12-29 DIAGNOSIS — Z89021 Acquired absence of right finger(s): Secondary | ICD-10-CM

## 2014-12-29 DIAGNOSIS — S71101A Unspecified open wound, right thigh, initial encounter: Secondary | ICD-10-CM | POA: Diagnosis present

## 2014-12-29 DIAGNOSIS — S7292XA Unspecified fracture of left femur, initial encounter for closed fracture: Secondary | ICD-10-CM

## 2014-12-29 DIAGNOSIS — Z01818 Encounter for other preprocedural examination: Secondary | ICD-10-CM

## 2014-12-29 DIAGNOSIS — Z23 Encounter for immunization: Secondary | ICD-10-CM

## 2014-12-29 DIAGNOSIS — Z9889 Other specified postprocedural states: Secondary | ICD-10-CM

## 2014-12-29 DIAGNOSIS — D62 Acute posthemorrhagic anemia: Secondary | ICD-10-CM | POA: Diagnosis not present

## 2014-12-29 DIAGNOSIS — Z885 Allergy status to narcotic agent status: Secondary | ICD-10-CM

## 2014-12-29 DIAGNOSIS — S72009A Fracture of unspecified part of neck of unspecified femur, initial encounter for closed fracture: Secondary | ICD-10-CM | POA: Diagnosis present

## 2014-12-29 DIAGNOSIS — W3400XA Accidental discharge from unspecified firearms or gun, initial encounter: Secondary | ICD-10-CM

## 2014-12-29 HISTORY — PX: FEMUR IM NAIL: SHX1597

## 2014-12-29 HISTORY — PX: I & D EXTREMITY: SHX5045

## 2014-12-29 LAB — COMPREHENSIVE METABOLIC PANEL
ALBUMIN: 4.5 g/dL (ref 3.5–5.0)
ALBUMIN: 4 g/dL (ref 3.5–5.0)
ALK PHOS: 60 U/L (ref 38–126)
ALT: 27 U/L (ref 17–63)
ALT: 23 U/L (ref 17–63)
ANION GAP: 5 (ref 5–15)
AST: 36 U/L (ref 15–41)
AST: 35 U/L (ref 15–41)
Alkaline Phosphatase: 49 U/L (ref 38–126)
Anion gap: 8 (ref 5–15)
BILIRUBIN TOTAL: 0.7 mg/dL (ref 0.3–1.2)
BILIRUBIN TOTAL: 0.5 mg/dL (ref 0.3–1.2)
BUN: 5 mg/dL — AB (ref 6–20)
BUN: 5 mg/dL — AB (ref 6–20)
CALCIUM: 8.5 mg/dL — AB (ref 8.9–10.3)
CHLORIDE: 106 mmol/L (ref 101–111)
CO2: 26 mmol/L (ref 22–32)
CO2: 26 mmol/L (ref 22–32)
Calcium: 8.1 mg/dL — ABNORMAL LOW (ref 8.9–10.3)
Chloride: 102 mmol/L (ref 101–111)
Creatinine, Ser: 0.96 mg/dL (ref 0.61–1.24)
Creatinine, Ser: 0.89 mg/dL (ref 0.61–1.24)
GFR calc Af Amer: >60 mL/min (ref >60)
GFR calc Af Amer: >60 mL/min (ref >60)
GFR calc non Af Amer: >60 mL/min (ref >60)
GFR calc non Af Amer: >60 mL/min (ref >60)
GLUCOSE: 162 mg/dL — AB (ref 65–99)
GLUCOSE: 111 mg/dL — AB (ref 65–99)
POTASSIUM: 2.8 mmol/L — AB (ref 3.5–5.1)
POTASSIUM: 3.5 mmol/L (ref 3.5–5.1)
SODIUM: 137 mmol/L (ref 135–145)
Sodium: 136 mmol/L (ref 135–145)
TOTAL PROTEIN: 8 g/dL (ref 6.5–8.1)
Total Protein: 7.1 g/dL (ref 6.5–8.1)

## 2014-12-29 LAB — CBC WITH DIFFERENTIAL/PLATELET
BASOS ABS: 0 10*3/uL (ref 0.0–0.1)
BASOS PCT: 0 %
BASOS PCT: 0 %
Basophils Absolute: 0 10*3/uL (ref 0.0–0.1)
EOS ABS: 0 10*3/uL (ref 0.0–0.7)
Eosinophils Absolute: 0.3 10*3/uL (ref 0.0–0.7)
Eosinophils Relative: 3 %
Eosinophils Relative: 0 %
HEMATOCRIT: 45.6 % (ref 39.0–52.0)
HEMATOCRIT: 42.4 % (ref 39.0–52.0)
HEMOGLOBIN: 15.2 g/dL (ref 13.0–17.0)
Hemoglobin: 16.4 g/dL (ref 13.0–17.0)
Lymphocytes Relative: 38 %
Lymphocytes Relative: 8 %
Lymphs Abs: 3.9 10*3/uL (ref 0.7–4.0)
Lymphs Abs: 1.2 10*3/uL (ref 0.7–4.0)
MCH: 33.2 pg (ref 26.0–34.0)
MCH: 32.8 pg (ref 26.0–34.0)
MCHC: 36 g/dL (ref 30.0–36.0)
MCHC: 35.8 g/dL (ref 30.0–36.0)
MCV: 92.3 fL (ref 78.0–100.0)
MCV: 91.6 fL (ref 78.0–100.0)
MONO ABS: 0.9 10*3/uL (ref 0.1–1.0)
MONO ABS: 0.9 10*3/uL (ref 0.1–1.0)
MONOS PCT: 9 %
MONOS PCT: 6 %
NEUTROS ABS: 5.1 10*3/uL (ref 1.7–7.7)
NEUTROS ABS: 13.1 10*3/uL — AB (ref 1.7–7.7)
NEUTROS PCT: 86 %
Neutrophils Relative %: 50 %
Platelets: 264 10*3/uL (ref 150–400)
Platelets: 250 10*3/uL (ref 150–400)
RBC: 4.94 MIL/uL (ref 4.22–5.81)
RBC: 4.63 MIL/uL (ref 4.22–5.81)
RDW: 13.3 % (ref 11.5–15.5)
RDW: 13.3 % (ref 11.5–15.5)
WBC: 10.2 10*3/uL (ref 4.0–10.5)
WBC: 15.2 10*3/uL — ABNORMAL HIGH (ref 4.0–10.5)

## 2014-12-29 LAB — SAMPLE TO BLOOD BANK

## 2014-12-29 LAB — TYPE AND SCREEN
ABO/RH(D): A POS
ANTIBODY SCREEN: NEGATIVE

## 2014-12-29 LAB — GLUCOSE, CAPILLARY: GLUCOSE-CAPILLARY: 91 mg/dL (ref 65–99)

## 2014-12-29 LAB — RAPID URINE DRUG SCREEN, HOSP PERFORMED
Amphetamines: NOT DETECTED
BARBITURATES: NOT DETECTED
Benzodiazepines: POSITIVE — AB
Cocaine: POSITIVE — AB
OPIATES: POSITIVE — AB
TETRAHYDROCANNABINOL: POSITIVE — AB

## 2014-12-29 LAB — URINALYSIS, ROUTINE W REFLEX MICROSCOPIC
BILIRUBIN URINE: NEGATIVE
Glucose, UA: NEGATIVE mg/dL
Ketones, ur: NEGATIVE mg/dL
Leukocytes, UA: NEGATIVE
Nitrite: NEGATIVE
PH: 6 (ref 5.0–8.0)
Protein, ur: NEGATIVE mg/dL
SPECIFIC GRAVITY, URINE: 1.04 — AB (ref 1.005–1.030)
UROBILINOGEN UA: 0.2 mg/dL (ref 0.0–1.0)

## 2014-12-29 LAB — ETHANOL: Alcohol, Ethyl (B): 192 mg/dL — ABNORMAL HIGH (ref <5)

## 2014-12-29 LAB — PROTIME-INR
INR: 1.08 (ref 0.00–1.49)
PROTHROMBIN TIME: 14.2 s (ref 11.6–15.2)

## 2014-12-29 LAB — URINE MICROSCOPIC-ADD ON

## 2014-12-29 LAB — SURGICAL PCR SCREEN
MRSA, PCR: POSITIVE — AB
STAPHYLOCOCCUS AUREUS: POSITIVE — AB

## 2014-12-29 LAB — APTT: aPTT: 22 seconds — ABNORMAL LOW (ref 24–37)

## 2014-12-29 SURGERY — INSERTION, INTRAMEDULLARY ROD, FEMUR, RETROGRADE
Anesthesia: General | Site: Leg Upper | Laterality: Right

## 2014-12-29 MED ORDER — ONDANSETRON HCL 4 MG/2ML IJ SOLN
INTRAMUSCULAR | Status: DC | PRN
  Administered 2014-12-29: 4 mg via INTRAVENOUS

## 2014-12-29 MED ORDER — CEFAZOLIN SODIUM-DEXTROSE 2-3 GM-% IV SOLR
INTRAVENOUS | Status: AC
  Filled 2014-12-29: qty 50

## 2014-12-29 MED ORDER — FENTANYL CITRATE (PF) 100 MCG/2ML IJ SOLN
INTRAMUSCULAR | Status: AC
  Administered 2014-12-29: 100 ug via INTRAVENOUS
  Filled 2014-12-29: qty 2

## 2014-12-29 MED ORDER — ROCURONIUM BROMIDE 50 MG/5ML IV SOLN
INTRAVENOUS | Status: AC
Start: 2014-12-29 — End: 2014-12-29
  Filled 2014-12-29: qty 1

## 2014-12-29 MED ORDER — FENTANYL CITRATE (PF) 250 MCG/5ML IJ SOLN
INTRAMUSCULAR | Status: AC
  Filled 2014-12-29: qty 5

## 2014-12-29 MED ORDER — VECURONIUM BROMIDE 10 MG IV SOLR
INTRAVENOUS | Status: DC | PRN
  Administered 2014-12-29 (×2): 2 mg via INTRAVENOUS

## 2014-12-29 MED ORDER — IOHEXOL 350 MG/ML SOLN
100.0000 mL | Freq: Once | INTRAVENOUS | Status: AC | PRN
  Administered 2014-12-29: 100 mL via INTRAVENOUS

## 2014-12-29 MED ORDER — FENTANYL CITRATE (PF) 100 MCG/2ML IJ SOLN
INTRAMUSCULAR | Status: DC | PRN
  Administered 2014-12-29: 25 ug via INTRAVENOUS
  Administered 2014-12-29: 100 ug via INTRAVENOUS
  Administered 2014-12-29: 50 ug via INTRAVENOUS

## 2014-12-29 MED ORDER — MIDAZOLAM HCL 2 MG/2ML IJ SOLN
INTRAMUSCULAR | Status: AC
  Filled 2014-12-29: qty 4

## 2014-12-29 MED ORDER — METOCLOPRAMIDE HCL 5 MG PO TABS: 5.0000 mg | ORAL_TABLET | Freq: Three times a day (TID) | ORAL | Status: DC | PRN

## 2014-12-29 MED ORDER — DEXTROSE 5 % IV SOLN
500.0000 mg | Freq: Four times a day (QID) | INTRAVENOUS | Status: DC | PRN
  Filled 2014-12-29: qty 5

## 2014-12-29 MED ORDER — MORPHINE SULFATE (PF) 2 MG/ML IV SOLN: 0.5000 mg | INTRAVENOUS | Status: DC | PRN

## 2014-12-29 MED ORDER — MUPIROCIN 2 % EX OINT
TOPICAL_OINTMENT | Freq: Two times a day (BID) | CUTANEOUS | Status: DC
  Administered 2014-12-29: 1 via NASAL
  Administered 2014-12-30 (×3): via NASAL
  Filled 2014-12-29 (×2): qty 22

## 2014-12-29 MED ORDER — FENTANYL CITRATE (PF) 100 MCG/2ML IJ SOLN
50.0000 ug | Freq: Once | INTRAMUSCULAR | Status: AC
  Administered 2014-12-29: 50 ug via INTRAVENOUS
  Filled 2014-12-29: qty 2

## 2014-12-29 MED ORDER — MORPHINE SULFATE (PF) 2 MG/ML IV SOLN
0.5000 mg | INTRAVENOUS | Status: DC | PRN
  Administered 2014-12-29: 0.5 mg via INTRAVENOUS
  Filled 2014-12-29 (×2): qty 1

## 2014-12-29 MED ORDER — LIDOCAINE HCL (CARDIAC) 20 MG/ML IV SOLN
INTRAVENOUS | Status: DC | PRN
  Administered 2014-12-29: 100 mg via INTRAVENOUS

## 2014-12-29 MED ORDER — 0.9 % SODIUM CHLORIDE (POUR BTL) OPTIME
TOPICAL | Status: DC | PRN
  Administered 2014-12-29: 1000 mL

## 2014-12-29 MED ORDER — MENTHOL 3 MG MT LOZG: 1.0000 | LOZENGE | OROMUCOSAL | Status: DC | PRN

## 2014-12-29 MED ORDER — OXYCODONE HCL 5 MG PO TABS
5.0000 mg | ORAL_TABLET | ORAL | Status: DC | PRN
  Administered 2014-12-29 – 2014-12-30 (×4): 10 mg via ORAL
  Filled 2014-12-29 (×2): qty 2

## 2014-12-29 MED ORDER — SODIUM CHLORIDE 0.9 % IV SOLN
INTRAVENOUS | Status: DC
  Administered 2014-12-29: 04:00:00 via INTRAVENOUS

## 2014-12-29 MED ORDER — PROPOFOL 10 MG/ML IV BOLUS
INTRAVENOUS | Status: DC | PRN
  Administered 2014-12-29: 200 mg via INTRAVENOUS

## 2014-12-29 MED ORDER — ONDANSETRON HCL 4 MG PO TABS
4.0000 mg | ORAL_TABLET | Freq: Four times a day (QID) | ORAL | Status: DC | PRN
Start: 2014-12-29 — End: 2014-12-31

## 2014-12-29 MED ORDER — LIDOCAINE HCL (CARDIAC) 20 MG/ML IV SOLN
INTRAVENOUS | Status: AC
  Filled 2014-12-29: qty 5

## 2014-12-29 MED ORDER — ASPIRIN EC 325 MG PO TBEC
325.0000 mg | DELAYED_RELEASE_TABLET | Freq: Two times a day (BID) | ORAL | Status: DC
  Administered 2014-12-29 – 2014-12-31 (×4): 325 mg via ORAL
  Filled 2014-12-29 (×4): qty 1

## 2014-12-29 MED ORDER — BUPIVACAINE-EPINEPHRINE (PF) 0.5% -1:200000 IJ SOLN
INTRAMUSCULAR | Status: DC | PRN
  Administered 2014-12-29: 30 mL via PERINEURAL

## 2014-12-29 MED ORDER — SODIUM CHLORIDE 0.9 % IR SOLN
Status: DC | PRN
  Administered 2014-12-29 (×2): 3000 mL

## 2014-12-29 MED ORDER — FENTANYL CITRATE (PF) 100 MCG/2ML IJ SOLN
INTRAMUSCULAR | Status: AC
  Filled 2014-12-29: qty 2

## 2014-12-29 MED ORDER — HYDROMORPHONE HCL 1 MG/ML IJ SOLN
INTRAMUSCULAR | Status: AC
  Administered 2014-12-29: 0.5 mg via INTRAVENOUS
  Filled 2014-12-29: qty 1

## 2014-12-29 MED ORDER — ARTIFICIAL TEARS OP OINT
TOPICAL_OINTMENT | OPHTHALMIC | Status: AC
  Filled 2014-12-29: qty 3.5

## 2014-12-29 MED ORDER — FENTANYL CITRATE (PF) 100 MCG/2ML IJ SOLN
100.0000 ug | Freq: Once | INTRAMUSCULAR | Status: DC
  Administered 2014-12-29: 100 ug via INTRAVENOUS

## 2014-12-29 MED ORDER — PROPOFOL 10 MG/ML IV BOLUS
INTRAVENOUS | Status: AC
Start: 2014-12-29 — End: 2014-12-29
  Filled 2014-12-29: qty 20

## 2014-12-29 MED ORDER — MIDAZOLAM HCL 5 MG/5ML IJ SOLN
INTRAMUSCULAR | Status: DC | PRN
  Administered 2014-12-29: 2 mg via INTRAVENOUS

## 2014-12-29 MED ORDER — MEPERIDINE HCL 25 MG/ML IJ SOLN: 6.2500 mg | INTRAMUSCULAR | Status: DC | PRN

## 2014-12-29 MED ORDER — FENTANYL CITRATE (PF) 100 MCG/2ML IJ SOLN
100.0000 ug | INTRAMUSCULAR | Status: DC | PRN
  Administered 2014-12-29 – 2014-12-30 (×2): 100 ug via INTRAVENOUS
  Filled 2014-12-29 (×2): qty 2

## 2014-12-29 MED ORDER — SODIUM CHLORIDE 0.9 % IV BOLUS (SEPSIS)
1000.0000 mL | Freq: Once | INTRAVENOUS | Status: AC
  Administered 2014-12-29: 1000 mL via INTRAVENOUS

## 2014-12-29 MED ORDER — VECURONIUM BROMIDE 10 MG IV SOLR
INTRAVENOUS | Status: AC
  Filled 2014-12-29: qty 10

## 2014-12-29 MED ORDER — OXYCODONE HCL ER 10 MG PO T12A: 10.0000 mg | EXTENDED_RELEASE_TABLET | Freq: Two times a day (BID) | ORAL | Status: AC

## 2014-12-29 MED ORDER — ASPIRIN EC 325 MG PO TBEC: 325.0000 mg | DELAYED_RELEASE_TABLET | Freq: Two times a day (BID) | ORAL | Status: AC

## 2014-12-29 MED ORDER — LACTATED RINGERS IV SOLN
INTRAVENOUS | Status: DC
  Administered 2014-12-29 (×2): via INTRAVENOUS

## 2014-12-29 MED ORDER — GLYCOPYRROLATE 0.2 MG/ML IJ SOLN
INTRAMUSCULAR | Status: DC | PRN
  Administered 2014-12-29: .4 mg via INTRAVENOUS

## 2014-12-29 MED ORDER — METOCLOPRAMIDE HCL 5 MG/ML IJ SOLN
5.0000 mg | Freq: Three times a day (TID) | INTRAMUSCULAR | Status: DC | PRN
  Administered 2014-12-29: 10 mg via INTRAVENOUS
  Filled 2014-12-29: qty 2

## 2014-12-29 MED ORDER — ACETAMINOPHEN 325 MG PO TABS
650.0000 mg | ORAL_TABLET | Freq: Four times a day (QID) | ORAL | Status: DC | PRN
  Administered 2014-12-30: 650 mg via ORAL
  Filled 2014-12-29: qty 2

## 2014-12-29 MED ORDER — HYDROCODONE-ACETAMINOPHEN 5-325 MG PO TABS
1.0000 | ORAL_TABLET | Freq: Four times a day (QID) | ORAL | Status: DC | PRN
  Administered 2014-12-29 – 2014-12-31 (×5): 2 via ORAL
  Filled 2014-12-29 (×5): qty 2

## 2014-12-29 MED ORDER — FENTANYL CITRATE (PF) 100 MCG/2ML IJ SOLN
50.0000 ug | Freq: Once | INTRAMUSCULAR | Status: AC
  Administered 2014-12-29: 50 ug via INTRAVENOUS

## 2014-12-29 MED ORDER — ACETAMINOPHEN 650 MG RE SUPP: 650.0000 mg | Freq: Four times a day (QID) | RECTAL | Status: DC | PRN

## 2014-12-29 MED ORDER — STERILE WATER FOR INJECTION IJ SOLN
INTRAMUSCULAR | Status: AC
  Filled 2014-12-29: qty 10

## 2014-12-29 MED ORDER — PHENOL 1.4 % MT LIQD: 1.0000 | OROMUCOSAL | Status: DC | PRN

## 2014-12-29 MED ORDER — PROMETHAZINE HCL 25 MG/ML IJ SOLN
6.2500 mg | INTRAMUSCULAR | Status: DC | PRN
Start: 2014-12-29 — End: 2014-12-29

## 2014-12-29 MED ORDER — CHLORHEXIDINE GLUCONATE CLOTH 2 % EX PADS
6.0000 | MEDICATED_PAD | Freq: Every day | CUTANEOUS | Status: DC
  Administered 2014-12-30 – 2014-12-31 (×2): 6 via TOPICAL

## 2014-12-29 MED ORDER — ALUM & MAG HYDROXIDE-SIMETH 200-200-20 MG/5ML PO SUSP: 30.0000 mL | ORAL | Status: DC | PRN

## 2014-12-29 MED ORDER — TETANUS-DIPHTH-ACELL PERTUSSIS 5-2.5-18.5 LF-MCG/0.5 IM SUSP
0.5000 mL | Freq: Once | INTRAMUSCULAR | Status: AC
  Administered 2014-12-29: 0.5 mL via INTRAMUSCULAR
  Filled 2014-12-29: qty 0.5

## 2014-12-29 MED ORDER — SODIUM CHLORIDE 0.9 % IV SOLN
INTRAVENOUS | Status: DC
  Administered 2014-12-29: 16:00:00 via INTRAVENOUS

## 2014-12-29 MED ORDER — HYDROMORPHONE HCL 1 MG/ML IJ SOLN
0.5000 mg | INTRAMUSCULAR | Status: AC | PRN
  Administered 2014-12-29 (×4): 0.5 mg via INTRAVENOUS

## 2014-12-29 MED ORDER — MUPIROCIN 2 % EX OINT: 1.0000 "application " | TOPICAL_OINTMENT | Freq: Two times a day (BID) | CUTANEOUS | Status: DC

## 2014-12-29 MED ORDER — NEOSTIGMINE METHYLSULFATE 10 MG/10ML IV SOLN
INTRAVENOUS | Status: DC | PRN
  Administered 2014-12-29: 3 mg via INTRAVENOUS

## 2014-12-29 MED ORDER — ONDANSETRON HCL 4 MG/2ML IJ SOLN
4.0000 mg | Freq: Four times a day (QID) | INTRAMUSCULAR | Status: DC | PRN
  Administered 2014-12-29 – 2014-12-30 (×2): 4 mg via INTRAVENOUS
  Filled 2014-12-29 (×2): qty 2

## 2014-12-29 MED ORDER — OXYCODONE HCL 5 MG PO TABS
5.0000 mg | ORAL_TABLET | ORAL | Status: DC | PRN
  Administered 2014-12-29 – 2014-12-30 (×2): 10 mg via ORAL
  Filled 2014-12-29 (×4): qty 2

## 2014-12-29 MED ORDER — METHOCARBAMOL 500 MG PO TABS
500.0000 mg | ORAL_TABLET | Freq: Four times a day (QID) | ORAL | Status: DC | PRN
  Administered 2014-12-30 (×2): 500 mg via ORAL
  Filled 2014-12-29 (×2): qty 1

## 2014-12-29 MED ORDER — ROCURONIUM BROMIDE 100 MG/10ML IV SOLN
INTRAVENOUS | Status: DC | PRN
  Administered 2014-12-29: 40 mg via INTRAVENOUS
  Administered 2014-12-29: 10 mg via INTRAVENOUS

## 2014-12-29 MED ORDER — CEFAZOLIN SODIUM-DEXTROSE 2-3 GM-% IV SOLR
2.0000 g | Freq: Four times a day (QID) | INTRAVENOUS | Status: AC
  Administered 2014-12-29 – 2014-12-30 (×3): 2 g via INTRAVENOUS
  Filled 2014-12-29 (×3): qty 50

## 2014-12-29 MED ORDER — OXYCODONE-ACETAMINOPHEN 5-325 MG PO TABS: 1.0000 | ORAL_TABLET | ORAL | Status: AC | PRN

## 2014-12-29 MED ORDER — CEFAZOLIN SODIUM-DEXTROSE 2-3 GM-% IV SOLR
2.0000 g | Freq: Once | INTRAVENOUS | Status: AC
  Administered 2014-12-29: 2 g via INTRAVENOUS

## 2014-12-29 MED ORDER — POTASSIUM CHLORIDE 10 MEQ/100ML IV SOLN
10.0000 meq | INTRAVENOUS | Status: AC
  Administered 2014-12-29 (×2): 10 meq via INTRAVENOUS
  Filled 2014-12-29: qty 100

## 2014-12-29 SURGICAL SUPPLY — 69 items
BANDAGE ELASTIC 4 VELCRO ST LF (GAUZE/BANDAGES/DRESSINGS) ×2 IMPLANT
BANDAGE ELASTIC 6 VELCRO ST LF (GAUZE/BANDAGES/DRESSINGS) ×8 IMPLANT
BANDAGE ESMARK 6X9 LF (GAUZE/BANDAGES/DRESSINGS) ×3 IMPLANT
BIT DRILL LONG 4.0 (BIT) ×1 IMPLANT
BIT DRILL SHORT 4.0 (BIT) ×2 IMPLANT
BNDG CMPR 9X6 STRL LF SNTH (GAUZE/BANDAGES/DRESSINGS) ×3
BNDG CMPR MD 5X2 ELC HKLP STRL (GAUZE/BANDAGES/DRESSINGS) ×3
BNDG COHESIVE 4X5 TAN STRL (GAUZE/BANDAGES/DRESSINGS) ×6 IMPLANT
BNDG ELASTIC 2 VLCR STRL LF (GAUZE/BANDAGES/DRESSINGS) ×3 IMPLANT
BNDG ESMARK 6X9 LF (GAUZE/BANDAGES/DRESSINGS) ×5
BNDG GAUZE ELAST 4 BULKY (GAUZE/BANDAGES/DRESSINGS) ×6 IMPLANT
COVER MAYO STAND STRL (DRAPES) ×5 IMPLANT
COVER SURGICAL LIGHT HANDLE (MISCELLANEOUS) ×5 IMPLANT
CUFF TOURNIQUET SINGLE 34IN LL (TOURNIQUET CUFF) ×2 IMPLANT
DRAPE C-ARM 42X72 X-RAY (DRAPES) ×5 IMPLANT
DRAPE C-ARMOR (DRAPES) ×5 IMPLANT
DRAPE EXTREMITY T 121X128X90 (DRAPE) ×5 IMPLANT
DRAPE IMP U-DRAPE 54X76 (DRAPES) ×8 IMPLANT
DRAPE INCISE IOBAN 66X45 STRL (DRAPES) ×6 IMPLANT
DRAPE ORTHO SPLIT 77X108 STRL (DRAPES) ×20
DRAPE POUCH INSTRU U-SHP 10X18 (DRAPES) ×5 IMPLANT
DRAPE SURG ORHT 6 SPLT 77X108 (DRAPES) ×4 IMPLANT
DRAPE U-SHAPE 47X51 STRL (DRAPES) ×8 IMPLANT
DRAPE UTILITY XL STRL (DRAPES) ×10 IMPLANT
DRILL BIT LONG 4.0 (BIT) ×5
DRILL BIT SHORT 4.0 (BIT) ×10
DRSG MEPILEX BORDER 4X4 (GAUZE/BANDAGES/DRESSINGS) ×3 IMPLANT
DRSG PAD ABDOMINAL 8X10 ST (GAUZE/BANDAGES/DRESSINGS) ×3 IMPLANT
DURAPREP 26ML APPLICATOR (WOUND CARE) ×8 IMPLANT
ELECT CAUTERY BLADE 6.4 (BLADE) ×5 IMPLANT
ELECT REM PT RETURN 9FT ADLT (ELECTROSURGICAL) ×5
ELECTRODE REM PT RTRN 9FT ADLT (ELECTROSURGICAL) ×3 IMPLANT
FACESHIELD WRAPAROUND (MASK) IMPLANT
FACESHIELD WRAPAROUND OR TEAM (MASK) IMPLANT
GAUZE SPONGE 4X4 12PLY STRL (GAUZE/BANDAGES/DRESSINGS) ×8 IMPLANT
GAUZE XEROFORM 1X8 LF (GAUZE/BANDAGES/DRESSINGS) ×7 IMPLANT
GAUZE XEROFORM 5X9 LF (GAUZE/BANDAGES/DRESSINGS) ×6 IMPLANT
GLOVE NEODERM STRL 7.5 LF PF (GLOVE) ×12 IMPLANT
GLOVE SURG NEODERM 7.5  LF PF (GLOVE) ×8
GOWN STRL REIN XL XLG (GOWN DISPOSABLE) ×5 IMPLANT
GUIDE PIN 3.2MM (MISCELLANEOUS) ×5
GUIDE PIN ORTH 343X3.2XBRAD (MISCELLANEOUS) ×1 IMPLANT
GUIDE ROD 3.0 (MISCELLANEOUS) ×5
KIT BASIN OR (CUSTOM PROCEDURE TRAY) ×5 IMPLANT
MANIFOLD NEPTUNE II (INSTRUMENTS) ×5 IMPLANT
NAIL FEMORAL RETROGRADE 10X42 (Knees) ×3 IMPLANT
NS IRRIG 1000ML POUR BTL (IV SOLUTION) ×5 IMPLANT
PACK TOTAL JOINT (CUSTOM PROCEDURE TRAY) ×5 IMPLANT
PACK UNIVERSAL I (CUSTOM PROCEDURE TRAY) ×2 IMPLANT
PAD CAST 4YDX4 CTTN HI CHSV (CAST SUPPLIES) ×4 IMPLANT
PADDING CAST ABS 6INX4YD NS (CAST SUPPLIES) ×2
PADDING CAST ABS COTTON 6X4 NS (CAST SUPPLIES) ×1 IMPLANT
PADDING CAST COTTON 4X4 STRL (CAST SUPPLIES)
ROD GUIDE 3.0 (MISCELLANEOUS) ×1 IMPLANT
SCREW TRIGEN LOW PROF 5.0X37.5 (Screw) ×6 IMPLANT
SCREW TRIGEN LOW PROF 5.0X57.5 (Screw) ×3 IMPLANT
SCREW TRIGEN LOW PROF 5.0X62.5 (Screw) ×3 IMPLANT
SCREW TRIGEN LOW PROF 5.0X80 (Screw) ×3 IMPLANT
STAPLER SKIN PROX WIDE 3.9 (STAPLE) ×8 IMPLANT
STOCKINETTE IMPERVIOUS 9X36 MD (GAUZE/BANDAGES/DRESSINGS) ×6 IMPLANT
SUT ETHILON 2 0 FS 18 (SUTURE) ×15 IMPLANT
SUT ETHILON 3 0 PS 1 (SUTURE) ×3 IMPLANT
SUT MON AB 2-0 CT1 36 (SUTURE) ×3 IMPLANT
SUT VIC AB 0 CT1 27 (SUTURE) ×10
SUT VIC AB 0 CT1 27XBRD ANTBC (SUTURE) ×6 IMPLANT
SUT VIC AB 2-0 CT1 27 (SUTURE) ×10
SUT VIC AB 2-0 CT1 TAPERPNT 27 (SUTURE) ×6 IMPLANT
TOWEL OR 17X26 10 PK STRL BLUE (TOWEL DISPOSABLE) ×10 IMPLANT
WATER STERILE IRR 1000ML POUR (IV SOLUTION) ×5 IMPLANT

## 2014-12-29 NOTE — ED Notes (Signed)
Wounds covered with abd pads, and tape after using wound seal to stop remaining oozing at sights. Long leg splint placed on left leg per EDP order for transport to [facility] via [facility].

## 2014-12-29 NOTE — Discharge Instructions (Signed)
    1. Change dressings as needed 2. May shower but keep incisions covered and dry 3. Take aspirin to prevent blood clots 4. Take stool softeners as needed 5. Take pain meds as needed  

## 2014-12-29 NOTE — Anesthesia Preprocedure Evaluation (Addendum)
Anesthesia Evaluation  Patient identified by MRN, date of birth, ID band Patient awake    Reviewed: Allergy & Precautions, NPO status , Patient's Chart, lab work & pertinent test results  Airway Mallampati: I  TM Distance: >3 FB Neck ROM: Full    Dental  (+) Teeth Intact, Dental Advisory Given   Pulmonary Current Smoker,     + decreased breath sounds      Cardiovascular  Rhythm:Regular Rate:Normal     Neuro/Psych    GI/Hepatic   Endo/Other    Renal/GU      Musculoskeletal   Abdominal   Peds  Hematology   Anesthesia Other Findings   Reproductive/Obstetrics                        Anesthesia Physical Anesthesia Plan  ASA: I  Anesthesia Plan: General and Regional   Post-op Pain Management:    Induction: Intravenous  Airway Management Planned: Oral ETT  Additional Equipment:   Intra-op Plan:   Post-operative Plan: Extubation in OR  Informed Consent:   Dental advisory given  Plan Discussed with: CRNA and Anesthesiologist  Anesthesia Plan Comments:         Anesthesia Quick Evaluation

## 2014-12-29 NOTE — ED Notes (Signed)
Pt mother did not take pt belongings with her when she left. Attempted to call her and was unable to reach her on cel number in chart. [facility] that [facility], Reuel Boomaniel, Charity fundraiserN. While nurse in room with pt, he instructed pt to call his mother to come back to [facility] and retrieve pt belongings. Pt belongings have been given to security to hold for safety.

## 2014-12-29 NOTE — ED Notes (Signed)
[facility] has been in to see pt after initial evaluation by EDP. Portable xrays complted. VSS, CT angio to be completed now.

## 2014-12-29 NOTE — Anesthesia Procedure Notes (Addendum)
Procedure Name: Intubation Date/Time: 12/29/2014 11:26 AM Performed by: Jefm MilesENNIE, JULIE E Pre-anesthesia Checklist: Patient identified, Emergency Drugs available, Suction available, Patient being monitored and Timeout performed Patient Re-evaluated:Patient Re-evaluated prior to inductionOxygen Delivery Method: Circle system utilized Preoxygenation: Pre-oxygenation with 100% oxygen Intubation Type: IV induction Ventilation: Mask ventilation without difficulty Laryngoscope Size: Mac and 3 Grade View: Grade I Tube type: Oral Tube size: 7.5 mm Number of attempts: 1 Airway Equipment and Method: Stylet Placement Confirmation: ETT inserted through vocal cords under direct vision,  positive ETCO2 and breath sounds checked- equal and bilateral Secured at: 22 cm Tube secured with: Tape Dental Injury: Teeth and Oropharynx as per pre-operative assessment    Anesthesia Regional Block:  Femoral nerve block  Pre-Anesthetic Checklist: ,, timeout performed, Correct Patient, Correct Site, Correct Laterality, Correct Procedure, Correct Position, site marked, Risks and benefits discussed, Surgical consent,  Pre-op evaluation,  Post-op pain management  Laterality: Left  Prep: chloraprep       Needles:  Injection technique: Single-shot  Needle Type: Stimulator Needle - 80     Needle Length: 9cm 9 cm Needle Gauge: 22 and 22 G  Needle insertion depth: 6 cm   Additional Needles:  Procedures: ultrasound guided (picture in chart) and nerve stimulator Femoral nerve block  Nerve Stimulator or Paresthesia:  Response: Patellar snap, 0.5 mA,   Additional Responses:   Narrative:  Injection made incrementally with aspirations every 5 mL.  Performed by: Personally  Anesthesiologist: Lewie LoronGERMEROTH, Artemis Koller  Additional Notes: BP cuff, EKG monitors applied. Sedation begun. Femoral artery palpated for location of nerve. After nerve location verified with U/S, anesthetic injected incrementally, slowly, and  after negative aspirations under direct u/s guidance. Good perineural spread. Patient tolerated well.

## 2014-12-29 NOTE — Anesthesia Postprocedure Evaluation (Signed)
Anesthesia Post Note  Patient: Philip Espinoza  Procedure(s) Performed: Procedure(s) (LRB): INTRAMEDULLARY (IM) RETROGRADE FEMORAL NAILING AND IRRIGATION AND DEBRIDMENT OF LEFT LEG (Left) IRRIGATION AND DEBRIDEMENT EXTREMITY RIGHT LEG (Right)  Anesthesia type: General + FNB  Patient location: PACU  Post pain: Pain level controlled  Post assessment: Post-op Vital signs reviewed  Last Vitals: BP 131/82 mmHg  Pulse 89  Temp(Src) 36.6 C (Oral)  Resp 16  Ht 6\' 1"  (1.854 m)  Wt 170 lb (77.111 kg)  BMI 22.43 kg/m2  SpO2 99%  Post vital signs: Reviewed  Level of consciousness: sedated  Complications: No apparent anesthesia complications
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
## 2014-12-29 NOTE — Transfer of Care (Signed)
Immediate Anesthesia Transfer of Care Note  Patient: Philip Espinoza  Procedure(s) Performed: Procedure(s): INTRAMEDULLARY (IM) RETROGRADE FEMORAL NAILING AND IRRIGATION AND DEBRIDMENT OF LEFT LEG (Left) IRRIGATION AND DEBRIDEMENT EXTREMITY RIGHT LEG (Right)  Patient Location: PACU  Anesthesia Type:General  Level of Consciousness: awake, alert  and patient cooperative  Airway & Oxygen Therapy: Patient Spontanous Breathing and Patient connected to nasal cannula oxygen  Post-op Assessment: Report given to RN and Patient moving all extremities X 4  Post vital signs: Reviewed and stable  Last Vitals:  Filed Vitals:   12/29/14 0949  BP: 136/70  Pulse: 92  Temp: 36.7 C  Resp: 20    Complications: No apparent anesthesia complications
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
## 2014-12-30 LAB — BASIC METABOLIC PANEL
Anion gap: 9 (ref 5–15)
BUN: <5 mg/dL — ABNORMAL LOW (ref 6–20)
CALCIUM: 7.8 mg/dL — AB (ref 8.9–10.3)
CO2: 27 mmol/L (ref 22–32)
CREATININE: 0.84 mg/dL (ref 0.61–1.24)
Chloride: 102 mmol/L (ref 101–111)
GFR calc non Af Amer: >60 mL/min (ref >60)
GLUCOSE: 94 mg/dL (ref 65–99)
Potassium: 3.4 mmol/L — ABNORMAL LOW (ref 3.5–5.1)
Sodium: 138 mmol/L (ref 135–145)

## 2014-12-30 LAB — CBC
HEMATOCRIT: 32.2 % — AB (ref 39.0–52.0)
Hemoglobin: 11.1 g/dL — ABNORMAL LOW (ref 13.0–17.0)
MCH: 31.7 pg (ref 26.0–34.0)
MCHC: 34.5 g/dL (ref 30.0–36.0)
MCV: 92 fL (ref 78.0–100.0)
Platelets: 217 10*3/uL (ref 150–400)
RBC: 3.5 MIL/uL — ABNORMAL LOW (ref 4.22–5.81)
RDW: 13.4 % (ref 11.5–15.5)
WBC: 10.1 10*3/uL (ref 4.0–10.5)

## 2014-12-30 MED ORDER — POTASSIUM CHLORIDE 20 MEQ PO PACK
40.0000 meq | PACK | Freq: Every day | ORAL | Status: DC
Start: 2014-12-30 — End: 2014-12-30
  Filled 2014-12-30: qty 2

## 2014-12-30 MED ORDER — WHITE PETROLATUM GEL
Status: AC
  Administered 2014-12-30: 13:00:00
  Filled 2014-12-30: qty 1

## 2014-12-30 MED ORDER — POTASSIUM CHLORIDE CRYS ER 20 MEQ PO TBCR
40.0000 meq | EXTENDED_RELEASE_TABLET | Freq: Every day | ORAL | Status: DC
  Administered 2014-12-30 – 2014-12-31 (×2): 40 meq via ORAL
  Filled 2014-12-30 (×2): qty 2

## 2014-12-30 NOTE — Progress Notes (Signed)
Utilization Review Completed.Stuti Sandin T11/07/2014  

## 2014-12-30 NOTE — Progress Notes (Addendum)
Patient ID: Philip Espinoza, male   DOB: 11/21/1990, 24 y.o.   MRN: [id number] Postoperative day 1 retrograde intramedullary nailing of the femur. Patient is alert and oriented this morning complained of pain last night plan for physical therapy non weightbearing on the left lower extremity.

## 2014-12-30 NOTE — Progress Notes (Signed)
Pt has received all analgesics available to him and he is still complaining of severe pain in his left thigh. He would like to speak to MD about increasing or changing his medications for pain.

## 2014-12-30 NOTE — Progress Notes (Signed)
Orthopedic Tech Progress Note Patient Details:  Philip Espinoza 03/13/1990 [id number]  Ortho Devices Type of Ortho Device: Crutches Ortho Device/Splint Interventions: Philip SprinklesOrdered   Philip Espinoza 12/30/2014, 1:08 PM
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
## 2014-12-31 ENCOUNTER — Encounter (HOSPITAL_COMMUNITY): Payer: Self-pay | Admitting: Orthopaedic Surgery

## 2014-12-31 LAB — CBC
HEMATOCRIT: 29.7 % — AB (ref 39.0–52.0)
Hemoglobin: 10.2 g/dL — ABNORMAL LOW (ref 13.0–17.0)
MCH: 31.6 pg (ref 26.0–34.0)
MCHC: 34.3 g/dL (ref 30.0–36.0)
MCV: 92 fL (ref 78.0–100.0)
PLATELETS: 213 10*3/uL (ref 150–400)
RBC: 3.23 MIL/uL — AB (ref 4.22–5.81)
RDW: 13.1 % (ref 11.5–15.5)
WBC: 10 10*3/uL (ref 4.0–10.5)

## 2014-12-31 LAB — VITAMIN D 25 HYDROXY (VIT D DEFICIENCY, FRACTURES): Vit D, 25-Hydroxy: 11.9 ng/mL — ABNORMAL LOW (ref 30.0–100.0)

## 2014-12-31 LAB — BASIC METABOLIC PANEL
ANION GAP: 7 (ref 5–15)
BUN: <5 mg/dL — ABNORMAL LOW (ref 6–20)
CALCIUM: 8.3 mg/dL — AB (ref 8.9–10.3)
CO2: 29 mmol/L (ref 22–32)
CREATININE: 0.8 mg/dL (ref 0.61–1.24)
Chloride: 100 mmol/L — ABNORMAL LOW (ref 101–111)
GFR calc non Af Amer: >60 mL/min (ref >60)
GLUCOSE: 102 mg/dL — AB (ref 65–99)
POTASSIUM: 3.7 mmol/L (ref 3.5–5.1)
Sodium: 136 mmol/L (ref 135–145)

## 2014-12-31 MED FILL — Ondansetron HCl Inj 4 MG/2ML (2 MG/ML): INTRAMUSCULAR | Qty: 2 | Status: AC

## 2014-12-31 MED FILL — Fentanyl Citrate Preservative Free (PF) Inj 100 MCG/2ML: INTRAMUSCULAR | Qty: 2 | Status: AC

## 2014-12-31 NOTE — Progress Notes (Signed)
   Subjective:  Patient reports pain as moderate.    Objective:   VITALS:   Filed Vitals:   12/30/14 0529 12/30/14 1311 12/30/14 1954 12/31/14 0500  BP: 146/84 127/69 142/86 123/69  Pulse: 78 83 97 85  Temp: 98.4 F (36.9 C) 98.9 F (37.2 C) 98.6 F (37 C) 98.6 F (37 C)  TempSrc: Oral Oral Oral   Resp: 16 17 16 15   Height:      Weight:      SpO2: 99% 99% 100% 100%    Neurologically intact Neurovascular intact Sensation intact distally Intact pulses distally Dorsiflexion/Plantar flexion intact Incision: dressing C/D/I and no drainage No cellulitis present Compartment soft   Lab Results  Component Value Date   WBC 10.0 12/31/2014   HGB 10.2* 12/31/2014   HCT 29.7* 12/31/2014   MCV 92.0 12/31/2014   PLT 213 12/31/2014     Assessment/Plan:  2 Days Post-Op   - Expected postop acute blood loss anemia - will monitor for symptoms - Up with PT/OT - DVT ppx - SCDs, ambulation, asa - NWB operative extremity - Pain control - Discharge planning - home today  Cheral AlmasXu, Naiping Michael 12/31/2014, 6:59 AM [phone]
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
## 2015-01-01 ENCOUNTER — Encounter (HOSPITAL_COMMUNITY): Payer: Self-pay | Admitting: Orthopaedic Surgery

## 2016-09-07 ENCOUNTER — Emergency Department (HOSPITAL_COMMUNITY)
Admission: EM | Admit: 2016-09-07 | Discharge: 2016-09-07 | Disposition: A | Discharge status: Home / Self Care | Payer: Self-pay | Attending: Emergency Medicine | Admitting: Emergency Medicine

## 2016-09-07 ENCOUNTER — Encounter (HOSPITAL_COMMUNITY): Payer: Self-pay

## 2016-09-07 DIAGNOSIS — Y999 Unspecified external cause status: Secondary | ICD-10-CM | POA: Insufficient documentation

## 2016-09-07 DIAGNOSIS — Y939 Activity, unspecified: Secondary | ICD-10-CM | POA: Insufficient documentation

## 2016-09-07 DIAGNOSIS — F17200 Nicotine dependence, unspecified, uncomplicated: Secondary | ICD-10-CM | POA: Insufficient documentation

## 2016-09-07 DIAGNOSIS — Y929 Unspecified place or not applicable: Secondary | ICD-10-CM | POA: Insufficient documentation

## 2016-09-07 DIAGNOSIS — X58XXXA Exposure to other specified factors, initial encounter: Secondary | ICD-10-CM | POA: Insufficient documentation

## 2016-09-07 DIAGNOSIS — H1032 Unspecified acute conjunctivitis, left eye: Secondary | ICD-10-CM | POA: Insufficient documentation

## 2016-09-07 DIAGNOSIS — S0502XA Injury of conjunctiva and corneal abrasion without foreign body, left eye, initial encounter: Secondary | ICD-10-CM | POA: Insufficient documentation

## 2016-09-07 MED ORDER — TETRACAINE HCL 0.5 % OP SOLN
1.0000 [drp] | Freq: Once | OPHTHALMIC | Status: AC
  Administered 2016-09-07: 1 [drp] via OPHTHALMIC
  Filled 2016-09-07: qty 4

## 2016-09-07 MED ORDER — FLUORESCEIN SODIUM 0.6 MG OP STRP
1.0000 | ORAL_STRIP | Freq: Once | OPHTHALMIC | Status: AC
  Administered 2016-09-07: 1 via OPHTHALMIC
  Filled 2016-09-07: qty 1

## 2016-09-07 MED ORDER — ERYTHROMYCIN 5 MG/GM OP OINT
TOPICAL_OINTMENT | Freq: Once | OPHTHALMIC | Status: AC
  Administered 2016-09-07: 12:00:00 via OPHTHALMIC
  Filled 2016-09-07: qty 3.5

## 2016-09-07 MED ORDER — KETOROLAC TROMETHAMINE 0.5 % OP SOLN
1.0000 [drp] | Freq: Once | OPHTHALMIC | Status: AC
  Administered 2016-09-07: 1 [drp] via OPHTHALMIC
  Filled 2016-09-07: qty 5

## 2016-09-07 NOTE — ED Triage Notes (Signed)
Reports of left eye swelling and pain x1 week. [facility] (antibiotic) with no relief. 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

## 2016-09-07 NOTE — Discharge Instructions (Signed)
Apply the antibiotic ointment to your left eye 3 times daily.  You may also apply one drop of the ketorolac which can help with pain 4 times daily.  Avoid rubbing your eye.

## 2016-09-07 NOTE — ED Provider Notes (Signed)
[facility] Provider Note   CSN: [id number] Arrival date & time: 09/07/16  1028     History   Chief Complaint Chief Complaint  Patient presents with  . Eye Pain    HPI Philip Espinoza is a 26 y.o. male presenting with a one-week history of left eye irritation.  He describes generalized erythema, clear tearing from the eye and irritation which became painful several days ago.  He denies any injury to the eye, denies foreign body sensation but has increased pain without improvement.  He has had no changes in visual acuity, denies fevers or chills.  He works as a Administrator but again denies any injury or foreign body problem.  He does not wear glasses or contact lenses.  He was seen at an outside hospital 5 days ago and was put on gentamicin drops, treatment for pinkeye, but pain worsened several days ago.  The history is provided by the patient.    History reviewed. No pertinent past medical history.  Patient Active Problem List   Diagnosis Date Noted  . Femur fracture, left (HCC) 12/29/2014  . Hip fracture (HCC) 12/29/2014  . 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 AMPUTATION  01/21/2011   Procedure: AMPUTATION DIGIT;  Surgeon: Fuller Canada, MD;  Location: [facility];  Service: Orthopedics;  Laterality: Right;  Partial amputation of Right Long Finger  . FEMUR IM NAIL Left 12/29/2014   Procedure: INTRAMEDULLARY (IM) RETROGRADE FEMORAL NAILING AND IRRIGATION AND DEBRIDMENT OF LEFT LEG;  Surgeon: Tarry Kos, MD;  Location: [facility];  Service: Orthopedics;  Laterality: Left;  . I&D EXTREMITY  01/21/2011   Procedure: IRRIGATION AND DEBRIDEMENT EXTREMITY;  Surgeon: Fuller Canada, MD;  Location: [facility];  Service: Orthopedics;  Laterality: Right;   Right Ring and Right Long Finger  . I&D EXTREMITY Right 12/29/2014   Procedure: IRRIGATION AND DEBRIDEMENT EXTREMITY RIGHT LEG;   Surgeon: Tarry Kos, MD;  Location: [facility];  Service: Orthopedics;  Laterality: Right;  . NO PAST SURGERIES         Home Medications    Prior to Admission medications   Medication Sig Start Date End Date Taking? Authorizing Provider  aspirin EC 325 MG tablet Take 1 tablet (325 mg total) by mouth 2 (two) times daily. 12/29/14   Tarry Kos, MD  oxyCODONE (OXYCONTIN) 10 mg 12 hr tablet Take 1 tablet (10 mg total) by mouth every 12 (twelve) hours. 12/29/14   Tarry Kos, MD  oxyCODONE-acetaminophen (PERCOCET) 5-325 MG tablet Take 1-2 tablets by mouth every 4 (four) hours as needed for severe pain. 12/29/14   Tarry Kos, MD  traMADol (ULTRAM) 50 MG tablet Take 1 tablet (50 mg total) by mouth every 6 (six) hours as needed for pain. Patient not taking: Reported on 12/29/2014 08/18/12   Darreld Mclean, MD    Family History Family History  Problem Relation Age of Onset  . Diabetes Unknown   . Anesthesia problems Neg Hx   . Hypotension Neg Hx   . Malignant hyperthermia Neg Hx   . Pseudochol deficiency Neg Hx     Social History Social History  Substance Use Topics  . Smoking status: Current Every Day Smoker  . Smokeless tobacco: Never Used  . Alcohol use Yes     Comment: beer daily     Allergies   Patient has no known allergies.   Review of Systems  Review of Systems  Constitutional: Negative for chills and fever.  HENT: Positive for rhinorrhea.   Eyes: Positive for photophobia, discharge and redness. Negative for visual disturbance.  All other systems reviewed and are negative.    Physical Exam Updated Vital Signs BP 129/77 (BP Location: Right Arm)   Pulse 83   Temp 97.9 F (36.6 C) (Oral)   Resp 17   Ht 6\' 1"  (1.854 m)   Wt 81.6 kg (180 lb)   SpO2 100%   BMI 23.75 kg/m   Physical Exam  Constitutional: He appears well-developed and well-nourished.  HENT:  Head: Normocephalic and atraumatic.  Eyes: Pupils are equal, round, and reactive to light. EOM are  normal. Right eye exhibits no discharge. Left eye exhibits no discharge and no exudate. No foreign body present in the left eye. Left conjunctiva is injected.  Slit lamp exam:      The left eye shows corneal abrasion and fluorescein uptake. The left eye shows no corneal flare, no corneal ulcer and no foreign body.  Copious clear tearing left eye.  No purulent dc.  Abrasion noted at the 7 oclock position.    Bilateral Distance: 20/20 R Distance: 20/20 L Distance: 20/50    Neck: Normal range of motion.  Cardiovascular: Normal rate.   Pulmonary/Chest: Effort normal.  Musculoskeletal: Normal range of motion.  Neurological: He is alert.  Skin: Skin is warm and dry.  Psychiatric: He has a normal mood and affect.  Nursing note and vitals reviewed.    ED Treatments / Results  Labs (all labs ordered are listed, but only abnormal results are displayed) Labs Reviewed - No data to display  EKG  EKG Interpretation None       Radiology No results found.  Procedures Procedures (including critical care time)  Medications Ordered in ED Medications  fluorescein ophthalmic strip 1 strip (1 strip Left Eye Given 09/07/16 1221)  tetracaine (PONTOCAINE) 0.5 % ophthalmic solution 1 drop (1 drop Left Eye Given 09/07/16 1221)  ketorolac (ACULAR) 0.5 % ophthalmic solution 1 drop (1 drop Left Eye Given 09/07/16 1220)  erythromycin ophthalmic ointment ( Left Eye Given 09/07/16 1220)     Initial Impression / Assessment and Plan / ED Course  I have reviewed the triage vital signs and the nursing notes.  Pertinent labs & imaging results that were available during my care of the patient were reviewed by me and considered in my medical decision making (see chart for details).    Pt with with corneal abrasion, no fb noted.  Suspect he may have caused the abrasion from rubbing the irritated eye.  He Was placed on ketorolac drops for inflammation, erythromycin ophthalmic ointment given for pain relief  and to help prevent infection.  He was given referral to Dr. Dione BoozeGroat for recheck if symptoms are not improving over the next 48 hours.  Final Clinical Impressions(s) / ED Diagnoses   Final diagnoses:  Abrasion of left cornea, initial encounter  Acute conjunctivitis of left eye, unspecified acute conjunctivitis type    New Prescriptions New Prescriptions   No medications on file     Victoriano Laindol, Alyanah Elliott, PA-C 09/07/16 1246    Blane OharaZavitz, Joshua, MD 09/09/16 [phone]

## 2016-09-08 ENCOUNTER — Encounter (HOSPITAL_COMMUNITY): Payer: Self-pay

## 2016-09-08 ENCOUNTER — Emergency Department (HOSPITAL_COMMUNITY)
Admission: EM | Admit: 2016-09-08 | Discharge: 2016-09-08 | Disposition: A | Discharge status: Home / Self Care | Payer: Self-pay | Attending: Emergency Medicine | Admitting: Emergency Medicine

## 2016-09-08 DIAGNOSIS — H11422 Conjunctival edema, left eye: Secondary | ICD-10-CM | POA: Insufficient documentation

## 2016-09-08 DIAGNOSIS — S0502XD Injury of conjunctiva and corneal abrasion without foreign body, left eye, subsequent encounter: Secondary | ICD-10-CM | POA: Insufficient documentation

## 2016-09-08 DIAGNOSIS — Y33XXXD Other specified events, undetermined intent, subsequent encounter: Secondary | ICD-10-CM | POA: Insufficient documentation

## 2016-09-08 DIAGNOSIS — S0502XA Injury of conjunctiva and corneal abrasion without foreign body, left eye, initial encounter: Secondary | ICD-10-CM

## 2016-09-08 DIAGNOSIS — F17210 Nicotine dependence, cigarettes, uncomplicated: Secondary | ICD-10-CM | POA: Insufficient documentation

## 2016-09-08 MED ORDER — POLYMYXIN B-TRIMETHOPRIM 10000-0.1 UNIT/ML-% OP SOLN: 2.0000 [drp] | OPHTHALMIC | 0 refills | Status: AC

## 2016-09-08 MED ORDER — POLYMYXIN B-TRIMETHOPRIM 10000-0.1 UNIT/ML-% OP SOLN: 2.0000 [drp] | OPHTHALMIC | 0 refills | Status: DC

## 2016-09-08 NOTE — ED Provider Notes (Signed)
Emergency Department Provider Note   I have reviewed the triage vital signs and the nursing notes.   HISTORY  Chief Complaint Eye Pain   HPI Philip Espinoza is a 26 y.o. male who returns to the emergency department for evaluation of left eye redness that is worsening. He was diagnosed with a corneal abrasion yesterday and started on erythromycin ointment. He denies any vision changes or worsening pain. No fevers or chills. He has been using the medication as prescribed. This morning his eye appeared slightly red but after using the erythromycin ointment he noticed swelling and worsening redness in the eye. Denies itching. He felt like symptoms are getting suddenly worse and so presented to the emergency department. No radiation of symptoms. He does not wear contact lenses.    History reviewed. No pertinent past medical history.  Patient Active Problem List   Diagnosis Date Noted  . Femur fracture, left (HCC) 12/29/2014  . Hip fracture (HCC) 12/29/2014  . 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 AMPUTATION  01/21/2011   Procedure: AMPUTATION DIGIT;  Surgeon: Fuller Canada, MD;  Location: [facility];  Service: Orthopedics;  Laterality: Right;  Partial amputation of Right Long Finger  . FEMUR IM NAIL Left 12/29/2014   Procedure: INTRAMEDULLARY (IM) RETROGRADE FEMORAL NAILING AND IRRIGATION AND DEBRIDMENT OF LEFT LEG;  Surgeon: Tarry Kos, MD;  Location: [facility];  Service: Orthopedics;  Laterality: Left;  . I&D EXTREMITY  01/21/2011   Procedure: IRRIGATION AND DEBRIDEMENT EXTREMITY;  Surgeon: Fuller Canada, MD;  Location: [facility];  Service: Orthopedics;  Laterality: Right;   Right Ring and Right Long Finger  . I&D EXTREMITY Right 12/29/2014   Procedure: IRRIGATION AND DEBRIDEMENT EXTREMITY RIGHT LEG;  Surgeon: Tarry Kos, MD;  Location: [facility];  Service: Orthopedics;  Laterality:  Right;  . NO PAST SURGERIES      Current Outpatient Rx  . Order #: [id number] Class: Print  . Order #: [id number] Class: Print  . Order #: [id number] Class: Print  . Order #: [id number] Class: Print  . Order #: [id number] Class: Print    Allergies Patient has no known allergies.  Family History  Problem Relation Age of Onset  . Diabetes Unknown   . Anesthesia problems Neg Hx   . Hypotension Neg Hx   . Malignant hyperthermia Neg Hx   . Pseudochol deficiency Neg Hx     Social History Social History  Substance Use Topics  . Smoking status: Current Every Day Smoker  . Smokeless tobacco: Never Used  . Alcohol use Yes     Comment: beer daily    Review of Systems  Constitutional: No fever/chills Eyes: No visual changes. Positive left eye redness and swelling.  ENT: No sore throat. Cardiovascular: Denies chest pain. Respiratory: Denies shortness of breath. Gastrointestinal: No abdominal pain.  No nausea, no vomiting.  No diarrhea.  No constipation. Genitourinary: Negative for dysuria. Musculoskeletal: Negative for back pain. Skin: Negative for rash. Neurological: Negative for headaches, focal weakness or numbness.  10-point ROS otherwise negative.  ____________________________________________   PHYSICAL EXAM:  VITAL SIGNS: ED Triage Vitals  Enc Vitals Group     BP 09/08/16 1043 137/81     Pulse Rate 09/08/16 1043 68     Resp 09/08/16 1043 18     Temp 09/08/16 1043 98 F (36.7 C)     Temp Source 09/08/16 1043 Oral     SpO2 09/08/16 1043  96 %     Weight 09/08/16 1042 180 lb (81.6 kg)     Height 09/08/16 1042 6\' 1"  (1.854 m)   Constitutional: Alert and oriented. Well appearing and in no acute distress. Eyes: Injected conjunctiva on the left without purulent discharge. Normal EOM. Normal pupillary response. Notable chemosis has developed diffusely.  Head: Atraumatic. Nose: No congestion/rhinnorhea. Mouth/Throat: Mucous membranes are moist.   Neck: No  stridor. Musculoskeletal: No lower extremity tenderness nor edema. No gross deformities of extremities. Neurologic:  Normal speech and language. No gross focal neurologic deficits are appreciated.  Skin:  Skin is warm, dry and intact. No rash noted. Psychiatric: Mood and affect are normal. Speech and behavior are normal.  ____________________________________________   PROCEDURES  Procedure(s) performed:   Procedures  None ____________________________________________   INITIAL IMPRESSION / ASSESSMENT AND PLAN / ED COURSE  Pertinent labs & imaging results that were available during my care of the patient were reviewed by me and considered in my medical decision making (see chart for details).  Patient presents to the emergency department for evaluation of worsening redness. He has some diffuse ecchymosis on exam. Full extraocular movements. No evidence of orbital cellulitis. Unclear if this is a reaction to eye ointment versus secondary to continued rubbing of the eye. Pain not significantly worsen. Plan to discontinue the erythromycin ointment and begin eyedrops. Advised ophthalmology follow-up at this point and provide additional contact information. Discussed return precautions in detail. Discussed the medication switch in detail with the patient.  Medical screening examination/treatment/procedure(s) were performed by non-physician practitioner and as supervising physician I was immediately available for consultation/collaboration.  Alona BeneJoshua Long, MD Emergency Medicine    ____________________________________________  FINAL CLINICAL IMPRESSION(S) / ED DIAGNOSES  Final diagnoses:  Chemosis of left conjunctiva  Abrasion of left cornea, initial encounter     MEDICATIONS GIVEN DURING THIS VISIT:  Medications - No data to display   NEW OUTPATIENT MEDICATIONS STARTED DURING THIS VISIT:  Current Discharge Medication List    START taking these medications   Details   trimethoprim-polymyxin b (POLYTRIM) ophthalmic solution Place 2 drops into the left eye every 4 (four) hours. Qty: 10 mL, Refills: 0          Note:  This document was prepared using Dragon voice recognition software and may include unintentional dictation errors.  Alona BeneJoshua Long, MD Emergency Medicine    Long, Arlyss RepressJoshua G, MD 09/08/16 [phone]

## 2016-09-08 NOTE — ED Triage Notes (Signed)
Pt says was seen here yesterday for an eye problem and was given erythromycin ointment.  Reports he used it twice and sclera started swelling.

## 2016-09-08 NOTE — Discharge Instructions (Signed)
You were seen in the ED today with eye irritation and redness. We are switching your antibiotic ointment and starting eye drops. Call the eye doctor today to set up the next available appointment.   Return to the ED with any vision changes, fever, chills, severe eye pain, or severe headache.
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
## 2018-05-07 ENCOUNTER — Emergency Department (HOSPITAL_COMMUNITY)
Admission: EM | Admit: 2018-05-07 | Discharge: 2018-05-25 | Disposition: E | Payer: Medicaid Other | Attending: Emergency Medicine | Admitting: Emergency Medicine

## 2018-05-07 ENCOUNTER — Encounter (HOSPITAL_COMMUNITY): Payer: Self-pay | Admitting: Emergency Medicine

## 2018-05-07 ENCOUNTER — Other Ambulatory Visit: Payer: Self-pay

## 2018-05-07 DIAGNOSIS — Y939 Activity, unspecified: Secondary | ICD-10-CM | POA: Insufficient documentation

## 2018-05-07 DIAGNOSIS — S3991XA Unspecified injury of abdomen, initial encounter: Secondary | ICD-10-CM | POA: Insufficient documentation

## 2018-05-07 DIAGNOSIS — Y929 Unspecified place or not applicable: Secondary | ICD-10-CM | POA: Diagnosis not present

## 2018-05-07 DIAGNOSIS — Z79899 Other long term (current) drug therapy: Secondary | ICD-10-CM | POA: Diagnosis not present

## 2018-05-07 DIAGNOSIS — F17200 Nicotine dependence, unspecified, uncomplicated: Secondary | ICD-10-CM | POA: Diagnosis not present

## 2018-05-07 DIAGNOSIS — Y999 Unspecified external cause status: Secondary | ICD-10-CM | POA: Diagnosis not present

## 2018-05-07 DIAGNOSIS — W3400XA Accidental discharge from unspecified firearms or gun, initial encounter: Secondary | ICD-10-CM | POA: Diagnosis not present

## 2018-05-09 LAB — TYPE AND SCREEN
UNIT DIVISION: 0
Unit division: 0

## 2018-05-09 LAB — BPAM RBC
BLOOD PRODUCT EXPIRATION DATE: 202004012359
Blood Product Expiration Date: 202004092359
ISSUE DATE / TIME: 202003140237
ISSUE DATE / TIME: 202003140237
UNIT TYPE AND RH: 9500
UNIT TYPE AND RH: 9500

## 2018-05-09 LAB — PREPARE RBC (CROSSMATCH)

## 2018-05-25 NOTE — ED Notes (Signed)
Emergency blood x 2 started and administered by pressure bags.
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
## 2018-05-25 NOTE — ED Notes (Addendum)
Patient's belongings including wallet, cell phone, clothes, shoes, and driver's license given to [employer] with [employer]. Patient had earrings in bilateral ears and watch on left wrist left on patient and transported to morgue with patient.
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
## 2018-05-25 NOTE — ED Notes (Signed)
Hold CPR, no palpable pulses, asystole on monitor. Time of death called 0226 by Dr Clayborne Dana.

## 2018-05-25 NOTE — ED Notes (Signed)
Weak palpable pulse. CPR in progress.

## 2018-05-25 NOTE — ED Notes (Addendum)
Pulse check, no palpable pulse. Restart CPR. Breathing assisted by bag valve mask.

## 2018-05-25 DEATH — deceased
